# Patient Record
Sex: Male | Born: 1950 | Race: Black or African American | Hispanic: No | Marital: Married | Smoking: Never smoker
Health system: Southern US, Community
[De-identification: ages and names within clinical notes are randomized; demographics above are authoritative.]

## PROBLEM LIST (undated history)

## (undated) DIAGNOSIS — I10 Essential (primary) hypertension: Secondary | ICD-10-CM

## (undated) DIAGNOSIS — H409 Unspecified glaucoma: Secondary | ICD-10-CM

## (undated) DIAGNOSIS — M1990 Unspecified osteoarthritis, unspecified site: Secondary | ICD-10-CM

## (undated) DIAGNOSIS — H547 Unspecified visual loss: Secondary | ICD-10-CM

## (undated) DIAGNOSIS — C61 Malignant neoplasm of prostate: Secondary | ICD-10-CM

## (undated) DIAGNOSIS — E7800 Pure hypercholesterolemia, unspecified: Secondary | ICD-10-CM

## (undated) HISTORY — DX: Pure hypercholesterolemia, unspecified: E78.00

## (undated) HISTORY — PX: EYE SURGERY: SHX253

## (undated) HISTORY — PX: OTHER SURGICAL HISTORY: SHX169

## (undated) HISTORY — DX: Malignant neoplasm of prostate: C61

## (undated) HISTORY — DX: Unspecified osteoarthritis, unspecified site: M19.90

## (undated) HISTORY — DX: Unspecified glaucoma: H40.9

## (undated) HISTORY — DX: Essential (primary) hypertension: I10

## (undated) HISTORY — PX: PROSTATE BIOPSY: SHX241

---

## 2007-05-11 ENCOUNTER — Emergency Department (HOSPITAL_COMMUNITY): Admission: EM | Admit: 2007-05-11 | Discharge: 2007-05-11 | Payer: Self-pay | Admitting: *Deleted

## 2007-10-08 ENCOUNTER — Emergency Department (HOSPITAL_COMMUNITY): Admission: EM | Admit: 2007-10-08 | Discharge: 2007-10-08 | Payer: Self-pay | Admitting: Family Medicine

## 2010-06-02 ENCOUNTER — Encounter: Admission: RE | Admit: 2010-06-02 | Discharge: 2010-06-02 | Payer: Self-pay | Admitting: Occupational Medicine

## 2011-04-02 ENCOUNTER — Emergency Department (HOSPITAL_COMMUNITY)
Admission: EM | Admit: 2011-04-02 | Discharge: 2011-04-02 | Disposition: A | Discharge status: Home / Self Care | Payer: Self-pay | Attending: Emergency Medicine | Admitting: Emergency Medicine

## 2011-04-02 DIAGNOSIS — E119 Type 2 diabetes mellitus without complications: Secondary | ICD-10-CM | POA: Insufficient documentation

## 2011-04-02 DIAGNOSIS — I10 Essential (primary) hypertension: Secondary | ICD-10-CM | POA: Insufficient documentation

## 2011-04-02 DIAGNOSIS — K089 Disorder of teeth and supporting structures, unspecified: Secondary | ICD-10-CM | POA: Insufficient documentation

## 2011-04-02 DIAGNOSIS — R42 Dizziness and giddiness: Secondary | ICD-10-CM | POA: Insufficient documentation

## 2011-04-02 DIAGNOSIS — R5381 Other malaise: Secondary | ICD-10-CM | POA: Insufficient documentation

## 2011-04-02 LAB — CBC
HCT: 40.3 % (ref 39.0–52.0)
Hemoglobin: 14.5 g/dL (ref 13.0–17.0)
RBC: 5.29 MIL/uL (ref 4.22–5.81)
WBC: 6.5 10*3/uL (ref 4.0–10.5)

## 2011-04-02 LAB — URINALYSIS, ROUTINE W REFLEX MICROSCOPIC
Nitrite: NEGATIVE
Protein, ur: 100 mg/dL — AB
Urobilinogen, UA: 1 mg/dL (ref 0.0–1.0)

## 2011-04-02 LAB — COMPREHENSIVE METABOLIC PANEL
Albumin: 3.5 g/dL (ref 3.5–5.2)
Alkaline Phosphatase: 117 U/L (ref 39–117)
BUN: 16 mg/dL (ref 6–23)
CO2: 27 mEq/L (ref 19–32)
Chloride: 100 mEq/L (ref 96–112)
GFR calc Af Amer: >60 mL/min (ref >60)
GFR calc non Af Amer: >60 mL/min (ref >60)
Glucose, Bld: 262 mg/dL — ABNORMAL HIGH (ref 70–99)
Potassium: 3.9 mEq/L (ref 3.5–5.1)
Total Bilirubin: 0.6 mg/dL (ref 0.3–1.2)

## 2011-04-02 LAB — DIFFERENTIAL
Basophils Absolute: 0 10*3/uL (ref 0.0–0.1)
Eosinophils Relative: 0 % (ref 0–5)
Lymphocytes Relative: 18 % (ref 12–46)
Monocytes Relative: 9 % (ref 3–12)

## 2011-04-02 LAB — POCT I-STAT TROPONIN I: Troponin i, poc: 0.02 ng/mL (ref 0.00–0.08)

## 2011-04-18 LAB — POCT H PYLORI SCREEN: H. PYLORI SCREEN, POC: POSITIVE — AB

## 2011-04-18 LAB — DIFFERENTIAL
Basophils Absolute: 0
Eosinophils Relative: 1
Lymphocytes Relative: 37
Neutrophils Relative %: 56

## 2011-04-18 LAB — CBC
HCT: 39.6
Platelets: 205
RDW: 15.5

## 2011-07-09 ENCOUNTER — Emergency Department (HOSPITAL_COMMUNITY)
Admission: EM | Admit: 2011-07-09 | Discharge: 2011-07-10 | Disposition: A | Discharge status: Home / Self Care | Payer: Medicaid Other | Attending: Emergency Medicine | Admitting: Emergency Medicine

## 2011-07-09 ENCOUNTER — Emergency Department (HOSPITAL_COMMUNITY): Payer: Medicaid Other

## 2011-07-09 ENCOUNTER — Encounter: Payer: Self-pay | Admitting: Emergency Medicine

## 2011-07-09 DIAGNOSIS — R319 Hematuria, unspecified: Secondary | ICD-10-CM | POA: Insufficient documentation

## 2011-07-09 DIAGNOSIS — Z79899 Other long term (current) drug therapy: Secondary | ICD-10-CM | POA: Insufficient documentation

## 2011-07-09 DIAGNOSIS — M25659 Stiffness of unspecified hip, not elsewhere classified: Secondary | ICD-10-CM | POA: Insufficient documentation

## 2011-07-09 DIAGNOSIS — E119 Type 2 diabetes mellitus without complications: Secondary | ICD-10-CM | POA: Insufficient documentation

## 2011-07-09 DIAGNOSIS — M546 Pain in thoracic spine: Secondary | ICD-10-CM | POA: Insufficient documentation

## 2011-07-09 DIAGNOSIS — M25559 Pain in unspecified hip: Secondary | ICD-10-CM | POA: Insufficient documentation

## 2011-07-09 DIAGNOSIS — M549 Dorsalgia, unspecified: Secondary | ICD-10-CM

## 2011-07-09 LAB — URINALYSIS, ROUTINE W REFLEX MICROSCOPIC
Ketones, ur: NEGATIVE mg/dL
Leukocytes, UA: NEGATIVE
Nitrite: NEGATIVE
Protein, ur: NEGATIVE mg/dL
pH: 6.5 (ref 5.0–8.0)

## 2011-07-09 MED ORDER — OXYCODONE-ACETAMINOPHEN 5-325 MG PO TABS
1.0000 | ORAL_TABLET | Freq: Once | ORAL | Status: AC
  Administered 2011-07-09: 1 via ORAL
  Filled 2011-07-09: qty 1

## 2011-07-09 NOTE — ED Notes (Signed)
PT. REPORTS LEFT HIP PAIN ONSET LAST Monday , DENIES INJURY OR FALL , AMBULATORY.

## 2011-07-09 NOTE — ED Provider Notes (Signed)
History     CSN: [id number] Arrival date & time: 07/09/2011  8:35 PM   First MD Initiated Contact with Patient 07/09/11 2214      Chief Complaint  Patient presents with  . Hip Pain   Patient developed atraumatic back pain one week ago. No change throughout the week. No prior hx of same. No URI or urinary symptoms. No recent travel or leg swelling.  Patient is a 59 y.o. male presenting with back pain. The history is provided by the patient and the spouse.  Back Pain  This is a new problem. The current episode started more than 1 week ago. The problem occurs constantly. 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 The pain is present in the thoracic spine. The quality of the pain is described as aching. The pain does not radiate. The pain is moderate. The symptoms are aggravated by bending, twisting and certain positions. The pain is the same all the time. Stiffness is present all day. Pertinent negatives include no chest pain, no fever, no numbness, no weight loss, no headaches, no abdominal pain, no abdominal swelling, no bowel incontinence, no perianal numbness, no bladder incontinence, no dysuria, no pelvic pain, no leg pain, no paresthesias, no paresis, no tingling and no weakness. He has tried nothing for the symptoms.    Past Medical History  Diagnosis Date  . Diabetes mellitus     History reviewed. No pertinent past surgical history.  No family history on file.  History  Substance Use Topics  . Smoking status: Never Smoker   . Smokeless tobacco: Not on file  . Alcohol Use: No      Review of Systems  Constitutional: Negative.  Negative for fever and weight loss.  HENT: Negative for neck pain and neck stiffness.   Respiratory: Negative.   Cardiovascular: Negative for chest pain.  Gastrointestinal: Negative.  Negative for abdominal pain and bowel incontinence.  Genitourinary: Negative.  Negative  for bladder incontinence, dysuria and pelvic pain.  Musculoskeletal: Positive for back pain. Negative for myalgias, joint swelling, arthralgias and gait problem.  Neurological: Negative.  Negative for tingling, weakness, numbness, headaches and paresthesias.  Psychiatric/Behavioral: Negative.   All other systems reviewed and are negative.    Allergies  Review of patient's allergies indicates no known allergies.  Home Medications   Current Outpatient Rx  Name Route Sig Dispense Refill  . GLIPIZIDE ER 10 MG PO TB24 Oral Take 10 mg by mouth daily.        BP 194/102  Pulse 59  Temp(Src) 97.6 F (36.4 C) (Oral)  Resp 20  SpO2 100%  Physical Exam  Constitutional: He is oriented to person, place, and time. He appears well-developed and well-nourished. No distress.  HENT:  Head: Normocephalic and atraumatic.  Eyes: Conjunctivae and EOM are normal. Pupils are equal, round, and reactive to light.  Neck: Normal range of motion. Neck supple.  Cardiovascular: Normal rate, regular rhythm and normal heart sounds.  Exam reveals no gallop and no friction rub.   No murmur heard. Pulmonary/Chest: Effort normal and breath sounds normal. No respiratory distress. He has no wheezes. He has no rales. He exhibits no tenderness.       No pain with inspiration  Abdominal: Soft. Bowel sounds are normal. He exhibits no distension and no mass. There is no tenderness. There is no rebound and no guarding.       Unable to elicit any abdominal  ttp on exam  Genitourinary:       Mild ttp in left CVA but really more over posterior rib.   Musculoskeletal: He exhibits tenderness. He exhibits no edema.       Pain on palpation of taut thoracic musculature. Pain increases with lateral rotation and bending and with full weight bearing on left side. No bony crepitus. No external signs of trauma. No rash.   Neurological: He is alert and oriented to person, place, and time. He has normal reflexes. No cranial nerve  deficit. He exhibits normal muscle tone. Coordination normal.  Skin: Skin is warm and dry. No rash noted. He is not diaphoretic. No erythema. No pallor.  Psychiatric: He has a normal mood and affect. His behavior is normal. Judgment and thought content normal.    ED Course  Procedures (including critical care time)  Labs Reviewed  URINALYSIS, ROUTINE W REFLEX MICROSCOPIC - Abnormal; Notable for the following:    Glucose, UA 500 (*)    Hgb urine dipstick SMALL (*)    All other components within normal limits  URINE MICROSCOPIC-ADD ON   No results found.   1. Back pain   2. Hematuria       MDM  5 AA male with pmh significant for DM. One week hx of left mid back pain worse with movement and palpation. No reported trauma and no s/s of infection. Doubt renal colic. No UTI. Noted to have mild hematuria, so will have follow up with urology. Doubt PE or ACS. D/w and seen by Dr. Adriana Simas. Pain improved with percocet times one in dept. Feel MSK etiology most likely. No widened mediastinum on CXR. VSS. Patient comfortable with d/c home with PMD referrals and understood reasons to return.       Marcell Anger, [place] 07/10/11 [phone]

## 2011-07-09 NOTE — ED Notes (Signed)
Gave pt a urinal. Stated he would go as soon as he could

## 2011-07-10 MED ORDER — OXYCODONE-ACETAMINOPHEN 5-325 MG PO TABS: 1.0000 | ORAL_TABLET | Freq: Four times a day (QID) | ORAL | Status: AC | PRN

## 2011-07-10 NOTE — ED Notes (Signed)
Pt shows no signs of neuro deficits 

## 2011-07-10 NOTE — ED Provider Notes (Signed)
Medical screening examination/treatment/procedure(s) were conducted as a shared visit with non-physician practitioner(s) and myself.  I personally evaluated the patient during the encounter.  Back pain appears to be musculoskeletal. Mild hematuria can be followed as an outpatient. History and physical not consistent with kidney stone  Donnetta Hutching, MD 07/10/11 [phone]

## 2012-01-02 ENCOUNTER — Encounter (INDEPENDENT_AMBULATORY_CARE_PROVIDER_SITE_OTHER): Payer: Medicaid Other | Admitting: Ophthalmology

## 2012-01-02 DIAGNOSIS — H4310 Vitreous hemorrhage, unspecified eye: Secondary | ICD-10-CM

## 2012-01-02 DIAGNOSIS — E1139 Type 2 diabetes mellitus with other diabetic ophthalmic complication: Secondary | ICD-10-CM

## 2012-01-02 DIAGNOSIS — E11359 Type 2 diabetes mellitus with proliferative diabetic retinopathy without macular edema: Secondary | ICD-10-CM

## 2012-01-02 DIAGNOSIS — H2510 Age-related nuclear cataract, unspecified eye: Secondary | ICD-10-CM

## 2012-01-02 DIAGNOSIS — E1165 Type 2 diabetes mellitus with hyperglycemia: Secondary | ICD-10-CM

## 2012-01-02 DIAGNOSIS — H3581 Retinal edema: Secondary | ICD-10-CM

## 2012-01-02 DIAGNOSIS — H43819 Vitreous degeneration, unspecified eye: Secondary | ICD-10-CM

## 2012-01-13 ENCOUNTER — Ambulatory Visit (INDEPENDENT_AMBULATORY_CARE_PROVIDER_SITE_OTHER): Payer: Medicaid Other | Admitting: Ophthalmology

## 2012-01-13 DIAGNOSIS — H3581 Retinal edema: Secondary | ICD-10-CM

## 2012-01-13 DIAGNOSIS — E1165 Type 2 diabetes mellitus with hyperglycemia: Secondary | ICD-10-CM

## 2012-01-13 DIAGNOSIS — E1139 Type 2 diabetes mellitus with other diabetic ophthalmic complication: Secondary | ICD-10-CM

## 2012-02-02 ENCOUNTER — Other Ambulatory Visit (INDEPENDENT_AMBULATORY_CARE_PROVIDER_SITE_OTHER): Payer: Medicaid Other | Admitting: Ophthalmology

## 2012-02-02 DIAGNOSIS — E11359 Type 2 diabetes mellitus with proliferative diabetic retinopathy without macular edema: Secondary | ICD-10-CM

## 2012-02-02 DIAGNOSIS — E1165 Type 2 diabetes mellitus with hyperglycemia: Secondary | ICD-10-CM

## 2012-02-02 DIAGNOSIS — E1139 Type 2 diabetes mellitus with other diabetic ophthalmic complication: Secondary | ICD-10-CM

## 2012-02-08 ENCOUNTER — Ambulatory Visit (INDEPENDENT_AMBULATORY_CARE_PROVIDER_SITE_OTHER): Payer: Medicaid Other | Admitting: Ophthalmology

## 2012-02-08 DIAGNOSIS — E1165 Type 2 diabetes mellitus with hyperglycemia: Secondary | ICD-10-CM

## 2012-02-08 DIAGNOSIS — E11359 Type 2 diabetes mellitus with proliferative diabetic retinopathy without macular edema: Secondary | ICD-10-CM

## 2012-04-11 ENCOUNTER — Encounter (INDEPENDENT_AMBULATORY_CARE_PROVIDER_SITE_OTHER): Payer: Medicaid Other | Admitting: Ophthalmology

## 2012-04-11 DIAGNOSIS — E1165 Type 2 diabetes mellitus with hyperglycemia: Secondary | ICD-10-CM

## 2012-04-11 DIAGNOSIS — H3581 Retinal edema: Secondary | ICD-10-CM

## 2012-04-11 DIAGNOSIS — H35039 Hypertensive retinopathy, unspecified eye: Secondary | ICD-10-CM

## 2012-04-11 DIAGNOSIS — H4010X0 Unspecified open-angle glaucoma, stage unspecified: Secondary | ICD-10-CM

## 2012-04-11 DIAGNOSIS — E11359 Type 2 diabetes mellitus with proliferative diabetic retinopathy without macular edema: Secondary | ICD-10-CM

## 2012-04-11 DIAGNOSIS — H2510 Age-related nuclear cataract, unspecified eye: Secondary | ICD-10-CM

## 2012-04-11 DIAGNOSIS — I10 Essential (primary) hypertension: Secondary | ICD-10-CM

## 2012-04-11 DIAGNOSIS — H43819 Vitreous degeneration, unspecified eye: Secondary | ICD-10-CM

## 2012-05-10 ENCOUNTER — Other Ambulatory Visit (INDEPENDENT_AMBULATORY_CARE_PROVIDER_SITE_OTHER): Payer: Medicaid Other | Admitting: Ophthalmology

## 2012-05-10 DIAGNOSIS — H3581 Retinal edema: Secondary | ICD-10-CM

## 2012-06-11 ENCOUNTER — Ambulatory Visit (INDEPENDENT_AMBULATORY_CARE_PROVIDER_SITE_OTHER): Payer: Medicaid Other | Admitting: Ophthalmology

## 2012-08-29 DIAGNOSIS — H4010X0 Unspecified open-angle glaucoma, stage unspecified: Secondary | ICD-10-CM | POA: Insufficient documentation

## 2012-09-10 ENCOUNTER — Ambulatory Visit (INDEPENDENT_AMBULATORY_CARE_PROVIDER_SITE_OTHER): Payer: Medicaid Other | Admitting: Ophthalmology

## 2012-09-10 DIAGNOSIS — H2510 Age-related nuclear cataract, unspecified eye: Secondary | ICD-10-CM

## 2012-09-10 DIAGNOSIS — E1165 Type 2 diabetes mellitus with hyperglycemia: Secondary | ICD-10-CM

## 2012-09-10 DIAGNOSIS — E11359 Type 2 diabetes mellitus with proliferative diabetic retinopathy without macular edema: Secondary | ICD-10-CM

## 2012-09-10 DIAGNOSIS — I10 Essential (primary) hypertension: Secondary | ICD-10-CM

## 2012-09-10 DIAGNOSIS — H35039 Hypertensive retinopathy, unspecified eye: Secondary | ICD-10-CM

## 2012-09-10 DIAGNOSIS — H43819 Vitreous degeneration, unspecified eye: Secondary | ICD-10-CM

## 2012-09-21 ENCOUNTER — Ambulatory Visit (INDEPENDENT_AMBULATORY_CARE_PROVIDER_SITE_OTHER): Payer: Medicaid Other | Admitting: Ophthalmology

## 2012-09-21 DIAGNOSIS — E1139 Type 2 diabetes mellitus with other diabetic ophthalmic complication: Secondary | ICD-10-CM

## 2012-12-27 ENCOUNTER — Ambulatory Visit (INDEPENDENT_AMBULATORY_CARE_PROVIDER_SITE_OTHER): Payer: Medicaid Other | Admitting: Ophthalmology

## 2012-12-27 DIAGNOSIS — E11359 Type 2 diabetes mellitus with proliferative diabetic retinopathy without macular edema: Secondary | ICD-10-CM

## 2012-12-27 DIAGNOSIS — I10 Essential (primary) hypertension: Secondary | ICD-10-CM

## 2012-12-27 DIAGNOSIS — H35039 Hypertensive retinopathy, unspecified eye: Secondary | ICD-10-CM

## 2012-12-27 DIAGNOSIS — H43819 Vitreous degeneration, unspecified eye: Secondary | ICD-10-CM

## 2012-12-27 DIAGNOSIS — E1139 Type 2 diabetes mellitus with other diabetic ophthalmic complication: Secondary | ICD-10-CM

## 2013-01-21 ENCOUNTER — Ambulatory Visit (INDEPENDENT_AMBULATORY_CARE_PROVIDER_SITE_OTHER): Payer: Medicaid Other | Admitting: Ophthalmology

## 2013-03-18 ENCOUNTER — Encounter (INDEPENDENT_AMBULATORY_CARE_PROVIDER_SITE_OTHER): Payer: Medicaid Other | Admitting: Ophthalmology

## 2013-03-18 DIAGNOSIS — I10 Essential (primary) hypertension: Secondary | ICD-10-CM

## 2013-03-18 DIAGNOSIS — H43819 Vitreous degeneration, unspecified eye: Secondary | ICD-10-CM

## 2013-03-18 DIAGNOSIS — E1139 Type 2 diabetes mellitus with other diabetic ophthalmic complication: Secondary | ICD-10-CM

## 2013-03-18 DIAGNOSIS — H35039 Hypertensive retinopathy, unspecified eye: Secondary | ICD-10-CM

## 2013-03-18 DIAGNOSIS — H4310 Vitreous hemorrhage, unspecified eye: Secondary | ICD-10-CM

## 2013-03-18 DIAGNOSIS — E11359 Type 2 diabetes mellitus with proliferative diabetic retinopathy without macular edema: Secondary | ICD-10-CM

## 2013-03-18 DIAGNOSIS — H3581 Retinal edema: Secondary | ICD-10-CM

## 2013-03-27 ENCOUNTER — Other Ambulatory Visit (INDEPENDENT_AMBULATORY_CARE_PROVIDER_SITE_OTHER): Payer: Medicaid Other | Admitting: Ophthalmology

## 2013-03-27 DIAGNOSIS — E1139 Type 2 diabetes mellitus with other diabetic ophthalmic complication: Secondary | ICD-10-CM

## 2013-03-27 DIAGNOSIS — H3581 Retinal edema: Secondary | ICD-10-CM

## 2013-04-19 ENCOUNTER — Encounter (INDEPENDENT_AMBULATORY_CARE_PROVIDER_SITE_OTHER): Payer: Medicaid Other | Admitting: Ophthalmology

## 2013-04-19 DIAGNOSIS — H4310 Vitreous hemorrhage, unspecified eye: Secondary | ICD-10-CM

## 2013-04-19 DIAGNOSIS — E1139 Type 2 diabetes mellitus with other diabetic ophthalmic complication: Secondary | ICD-10-CM

## 2013-04-19 DIAGNOSIS — E11349 Type 2 diabetes mellitus with severe nonproliferative diabetic retinopathy without macular edema: Secondary | ICD-10-CM

## 2013-04-19 DIAGNOSIS — E11359 Type 2 diabetes mellitus with proliferative diabetic retinopathy without macular edema: Secondary | ICD-10-CM

## 2013-05-27 ENCOUNTER — Ambulatory Visit (INDEPENDENT_AMBULATORY_CARE_PROVIDER_SITE_OTHER): Payer: Medicaid Other | Admitting: Ophthalmology

## 2013-07-03 ENCOUNTER — Encounter (INDEPENDENT_AMBULATORY_CARE_PROVIDER_SITE_OTHER): Payer: Medicare Other | Admitting: Ophthalmology

## 2013-07-03 DIAGNOSIS — I10 Essential (primary) hypertension: Secondary | ICD-10-CM

## 2013-07-03 DIAGNOSIS — H3581 Retinal edema: Secondary | ICD-10-CM

## 2013-07-03 DIAGNOSIS — H35039 Hypertensive retinopathy, unspecified eye: Secondary | ICD-10-CM

## 2013-07-03 DIAGNOSIS — H43819 Vitreous degeneration, unspecified eye: Secondary | ICD-10-CM

## 2013-07-03 DIAGNOSIS — E11359 Type 2 diabetes mellitus with proliferative diabetic retinopathy without macular edema: Secondary | ICD-10-CM

## 2013-07-03 DIAGNOSIS — E1139 Type 2 diabetes mellitus with other diabetic ophthalmic complication: Secondary | ICD-10-CM

## 2013-07-11 ENCOUNTER — Encounter (INDEPENDENT_AMBULATORY_CARE_PROVIDER_SITE_OTHER): Payer: Medicare Other | Admitting: Ophthalmology

## 2013-07-11 DIAGNOSIS — H3581 Retinal edema: Secondary | ICD-10-CM

## 2013-07-31 ENCOUNTER — Ambulatory Visit (INDEPENDENT_AMBULATORY_CARE_PROVIDER_SITE_OTHER): Payer: Medicaid Other | Admitting: Ophthalmology

## 2013-08-12 ENCOUNTER — Emergency Department (HOSPITAL_COMMUNITY)
Admission: EM | Admit: 2013-08-12 | Discharge: 2013-08-13 | Disposition: A | Discharge status: Home / Self Care | Payer: Medicare Other | Attending: Emergency Medicine | Admitting: Emergency Medicine

## 2013-08-12 ENCOUNTER — Encounter (HOSPITAL_COMMUNITY): Payer: Self-pay | Admitting: Emergency Medicine

## 2013-08-12 DIAGNOSIS — R197 Diarrhea, unspecified: Secondary | ICD-10-CM | POA: Insufficient documentation

## 2013-08-12 DIAGNOSIS — E119 Type 2 diabetes mellitus without complications: Secondary | ICD-10-CM | POA: Insufficient documentation

## 2013-08-12 DIAGNOSIS — K5900 Constipation, unspecified: Secondary | ICD-10-CM

## 2013-08-12 DIAGNOSIS — R1110 Vomiting, unspecified: Secondary | ICD-10-CM | POA: Insufficient documentation

## 2013-08-12 DIAGNOSIS — Z79899 Other long term (current) drug therapy: Secondary | ICD-10-CM | POA: Insufficient documentation

## 2013-08-12 NOTE — ED Notes (Signed)
PT reports Vomiting and diarrhea that started SAT. None on Sunday and Vomiting and diarrhea started again tonight.

## 2013-08-13 NOTE — ED Notes (Signed)
Pt reports he vomited 48 hrs ago,

## 2013-08-13 NOTE — ED Notes (Signed)
Pt reports he vomited x 3 overnight 48 hrs ago.  Only had gingerale all day Sunday.  Did not eat any food.  No further vomiting until 2000 Monday evening.  At 1900 had "bad bowel movement".  Wife said foul smelling.  Had not had a BM x 6-7 days per pt report.  Denies pain currently, states he feels better.  No more nausea.  Is here now because he feels weak and "my wife made me come."

## 2013-08-13 NOTE — ED Notes (Signed)
Pt given PO challenge. Drinking and eating crackers without incident.  Denies abdominal pain, nausea, vomiting.  Pt got up to BR, no issues.

## 2013-08-13 NOTE — ED Provider Notes (Signed)
CSN: [id number]     Arrival date & time 08/12/13  2312 History   First MD Initiated Contact with Patient 08/13/13 0103     Chief Complaint  Patient presents with  . Emesis  . Diarrhea   (Consider location/radiation/quality/duration/timing/severity/associated sxs/prior Treatment) Patient is a 63 y.o. male presenting with vomiting.  Emesis Severity:  Moderate Duration: tonight. Timing: once. Number of daily episodes:  1 Quality:  Undigested food Progression:  Resolved Chronicity:  New Relieved by:  Nothing Worsened by:  Nothing tried Associated symptoms: no abdominal pain, no diarrhea and no fever   Associated symptoms comment:  Constipation   Past Medical History  Diagnosis Date  . Diabetes mellitus    Past Surgical History  Procedure Laterality Date  . Eye surgery     No family history on file. History  Substance Use Topics  . Smoking status: Never Smoker   . Smokeless tobacco: Not on file  . Alcohol Use: No    Review of Systems  Constitutional: Negative for fever.  HENT: Negative for congestion.   Respiratory: Negative for cough and shortness of breath.   Cardiovascular: Negative for chest pain.  Gastrointestinal: Positive for vomiting. Negative for nausea, abdominal pain and diarrhea.  All other systems reviewed and are negative.    Allergies  Review of patient's allergies indicates no known allergies.  Home Medications   Current Outpatient Rx  Name  Route  Sig  Dispense  Refill  . Alogliptin-Pioglitazone (OSENI) 25-15 MG TABS   Oral   Take 1 tablet by mouth daily.         Marland Kitchen amLODipine (NORVASC) 10 MG tablet   Oral   Take 10 mg by mouth daily.         . brimonidine-timolol (COMBIGAN) 0.2-0.5 % ophthalmic solution   Both Eyes   Place 1 drop into both eyes 2 (two) times daily.         . dorzolamide (TRUSOPT) 2 % ophthalmic solution   Both Eyes   Place 1 drop into both eyes 3 (three) times daily.         . hydrochlorothiazide  (HYDRODIURIL) 25 MG tablet   Oral   Take 25 mg by mouth daily.         Marland Kitchen lisinopril (PRINIVIL,ZESTRIL) 10 MG tablet   Oral   Take 10 mg by mouth daily.         Marland Kitchen loteprednol (LOTEMAX) 0.5 % ophthalmic suspension   Both Eyes   Place 1 drop into both eyes 3 (three) times daily.         Marland Kitchen lovastatin (MEVACOR) 20 MG tablet   Oral   Take 20 mg by mouth daily.         . metFORMIN (GLUCOPHAGE) 500 MG tablet   Oral   Take 500 mg by mouth 2 (two) times daily with a meal.          BP 126/76  Pulse 98  Temp(Src) 97.6 F (36.4 C) (Oral)  Resp 16  SpO2 100% Physical Exam  Nursing note and vitals reviewed. Constitutional: He is oriented to person, place, and time. He appears well-developed and well-nourished. No distress.  HENT:  Head: Normocephalic and atraumatic.  Mouth/Throat: Oropharynx is clear and moist.  Eyes: Conjunctivae are normal. Pupils are equal, round, and reactive to light. No scleral icterus.  Neck: Neck supple.  Cardiovascular: Normal rate, regular rhythm, normal heart sounds and intact distal pulses.   No murmur heard. Pulmonary/Chest: Effort normal and breath  sounds normal. No stridor. No respiratory distress. He has no wheezes. He has no rales.  Abdominal: Soft. He exhibits no distension. There is no tenderness. There is no rebound and no guarding.  Musculoskeletal: Normal range of motion. He exhibits no edema.  Neurological: He is alert and oriented to person, place, and time.  Skin: Skin is warm and dry. No rash noted.  Psychiatric: He has a normal mood and affect. His behavior is normal.    ED Course  Procedures (including critical care time) Labs Review Labs Reviewed - No data to display Imaging Review No results found.  EKG Interpretation   None       MDM   1. Constipation   2. Vomiting    Well appearing 63 yo male who had one episode of vomiting which was at the same time as he had a voluminous solid bowel movement.  He stated that  he was constipated for several days.  (his wife reports this is likely secondary to diet).  After his one episode of emesis and this large bowel movement, he felt much better.  On exam, well appearing, not distressed, abd soft and nontender.  His history is indicative of mild constipation which self resolved.  I advised dietary changes.  Will ensure he can tolerate POs without becoming nauseated or vomiting.  He already has PCP follow up in less than 36 hours.    Tolerated PO's well.    Houston Siren, MD 08/13/13 [phone]

## 2013-08-13 NOTE — ED Notes (Signed)
Awaiting orders

## 2013-08-13 NOTE — Discharge Instructions (Signed)

## 2013-09-05 DIAGNOSIS — I10 Essential (primary) hypertension: Secondary | ICD-10-CM | POA: Insufficient documentation

## 2013-09-05 DIAGNOSIS — E785 Hyperlipidemia, unspecified: Secondary | ICD-10-CM | POA: Insufficient documentation

## 2013-09-05 DIAGNOSIS — E119 Type 2 diabetes mellitus without complications: Secondary | ICD-10-CM | POA: Insufficient documentation

## 2013-11-06 ENCOUNTER — Ambulatory Visit (INDEPENDENT_AMBULATORY_CARE_PROVIDER_SITE_OTHER): Payer: Medicare Other | Admitting: Ophthalmology

## 2013-11-06 DIAGNOSIS — E1139 Type 2 diabetes mellitus with other diabetic ophthalmic complication: Secondary | ICD-10-CM

## 2013-11-06 DIAGNOSIS — H43819 Vitreous degeneration, unspecified eye: Secondary | ICD-10-CM

## 2013-11-06 DIAGNOSIS — E1165 Type 2 diabetes mellitus with hyperglycemia: Secondary | ICD-10-CM

## 2013-11-06 DIAGNOSIS — H4310 Vitreous hemorrhage, unspecified eye: Secondary | ICD-10-CM

## 2013-11-06 DIAGNOSIS — H35039 Hypertensive retinopathy, unspecified eye: Secondary | ICD-10-CM

## 2013-11-06 DIAGNOSIS — I10 Essential (primary) hypertension: Secondary | ICD-10-CM

## 2013-11-06 DIAGNOSIS — E11359 Type 2 diabetes mellitus with proliferative diabetic retinopathy without macular edema: Secondary | ICD-10-CM

## 2014-02-05 DIAGNOSIS — Z961 Presence of intraocular lens: Secondary | ICD-10-CM | POA: Insufficient documentation

## 2014-03-12 ENCOUNTER — Ambulatory Visit (INDEPENDENT_AMBULATORY_CARE_PROVIDER_SITE_OTHER): Payer: Medicare Other | Admitting: Ophthalmology

## 2014-11-24 ENCOUNTER — Ambulatory Visit
Admission: RE | Admit: 2014-11-24 | Discharge: 2014-11-24 | Disposition: A | Discharge status: Home / Self Care | Payer: Medicare Other | Source: Ambulatory Visit | Attending: Radiation Oncology | Admitting: Radiation Oncology

## 2014-11-24 ENCOUNTER — Ambulatory Visit: Payer: Medicare Other | Admitting: Radiation Oncology

## 2014-11-24 NOTE — Progress Notes (Signed)
Vitals stable. Denies pain. Diminished vision related to effects of diabetes. Denies headache, dizziness, nausea, vomiting, diplopia or ringing in the ears. Denies cough or SOB. Here today with his sister.

## 2014-12-03 ENCOUNTER — Encounter: Payer: Self-pay | Admitting: Radiation Oncology

## 2014-12-03 NOTE — Progress Notes (Signed)
GU Location of Tumor / Histology: prostatic adenocarcinoma  If Prostate Cancer, Gleason Score is (3 + 4) and PSA is (11.27)  Christopher Rivas was diagnosed with prostate ca in 2014 and has been under active surveillance.  Biopsies of prostate (if applicable) revealed:    Past/Anticipated interventions by urology, if any: active surveillance, prostate biopsy, referral to rad onc  Past/Anticipated interventions by medical oncology, if any: no  Weight changes, if any: no  Bowel/Bladder complaints, if any: urinary frequency, nocturia x4, and weak urine stream    Nausea/Vomiting, if any: no  Pain issues, if any:  no  SAFETY ISSUES:  Prior radiation? no  Pacemaker/ICD? no  Possible current pregnancy? no  Is the patient on methotrexate? no  Current Complaints / other details:  64 year old male. Separated. Retired. Has 4 children.

## 2014-12-10 ENCOUNTER — Ambulatory Visit
Admission: RE | Admit: 2014-12-10 | Discharge: 2014-12-10 | Disposition: A | Discharge status: Home / Self Care | Payer: Medicare HMO | Source: Ambulatory Visit | Attending: Radiation Oncology | Admitting: Radiation Oncology

## 2014-12-10 ENCOUNTER — Encounter: Payer: Self-pay | Admitting: Radiation Oncology

## 2014-12-10 VITALS — BP 144/75 | HR 85 | Resp 16 | Ht 68.0 in | Wt 173.8 lb

## 2014-12-10 DIAGNOSIS — Z79899 Other long term (current) drug therapy: Secondary | ICD-10-CM | POA: Insufficient documentation

## 2014-12-10 DIAGNOSIS — C61 Malignant neoplasm of prostate: Secondary | ICD-10-CM | POA: Insufficient documentation

## 2014-12-10 DIAGNOSIS — Z510 Encounter for antineoplastic radiation therapy: Secondary | ICD-10-CM | POA: Diagnosis present

## 2014-12-10 NOTE — Progress Notes (Signed)
Radiation Oncology         [phone] ________________________________  Initial outpatient Consultation  Name: Christopher Rivas MRN: [id number]  Date: 12/10/2014  DOB: Nov 21, 1950  VO:[id number] NOT IN SYSTEM  Lowella Bandy, MD   REFERRING PHYSICIAN: Lowella Bandy, MD  DIAGNOSIS: 64 y.o. gentleman with stage T1c adenocarcinoma of the prostate with a Gleason's score of 3+4 and a PSA of 11.27    ICD-9-CM ICD-10-CM   1. Prostate cancer [place] PRESENT ILLNESS::Christopher Rivas is a 64 y.o. gentleman with a family history of prostate cancer in his father and 2 brothers who was found to have an elevated PSA of 11.27 in 2014. This led to a prostate biopsy on 02/21/2013 revealing prostate cancer with a maximum Gleason score of 3+4 in the distribution noted below:    The patient elected to pursue active surveillance. His PSA on 10/02/2013 was 9.54. His PSA on 04/09/2014 was 10.28.  The patient proceeded to follow-up transrectal ultrasound with 12 biopsies of the prostate on 04/30/2014.  The prostate volume measured 115 cc.  Out of 12 core biopsies, 5 were now positive.  The maximum Gleason score was 3+3, and this was seen in both sides in the distribution shown below.   He was noted to have a PSA of 9.32 on 11/03/2014.  On 11/10/2014, he followed up for evaluation in urology by Dr. Janice Norrie, and digital rectal examination was performed at that time revealing no nodules.    The patient reviewed the biopsy results with his urologist and he has kindly been referred today for discussion of potential radiation treatment options.  PREVIOUS RADIATION THERAPY: No  PAST MEDICAL HISTORY:  has a past medical history of Diabetes mellitus; Prostate cancer; Arthritis; Glaucoma; Hypercholesterolemia; and Hypertension.    PAST SURGICAL HISTORY: Past Surgical History  Procedure Laterality Date  . Eye surgery    . Cataract surgery    . Prostate biopsy      FAMILY HISTORY: family history includes Cancer  in his brother and brother.  SOCIAL HISTORY:  reports that he has never smoked. He has never used smokeless tobacco. He reports that he does not drink alcohol or use illicit drugs.  ALLERGIES: Review of patient's allergies indicates no known allergies.  MEDICATIONS:  Current Outpatient Prescriptions  Medication Sig Dispense Refill  . ACCU-CHEK AVIVA PLUS test strip 2 (two) times daily. for testing  1  . amLODipine (NORVASC) 10 MG tablet Take 10 mg by mouth daily.    . brimonidine-timolol (COMBIGAN) 0.2-0.5 % ophthalmic solution Place 1 drop into both eyes 2 (two) times daily.    . dorzolamide (TRUSOPT) 2 % ophthalmic solution Place 1 drop into both eyes 3 (three) times daily.    Marland Kitchen gabapentin (NEURONTIN) 300 MG capsule   0  . hydrochlorothiazide (HYDRODIURIL) 25 MG tablet Take 25 mg by mouth daily.    Marland Kitchen lisinopril (PRINIVIL,ZESTRIL) 10 MG tablet Take 10 mg by mouth daily.    Marland Kitchen lovastatin (MEVACOR) 20 MG tablet Take by mouth.    Marland Kitchen NOVOFINE 32G X 6 MM MISC   0  . pioglitazone (ACTOS) 15 MG tablet Take by mouth.    . sitaGLIPtin-metformin (JANUMET) 50-1000 MG per tablet Take by mouth.    . Alogliptin-Pioglitazone (OSENI) 25-15 MG TABS Take 1 tablet by mouth daily.    . fluorometholone (FML FORTE) 0.25 % ophthalmic suspension Apply to eye.    Marland Kitchen LEVEMIR FLEXTOUCH 100 UNIT/ML Pen   0  .  loteprednol (LOTEMAX) 0.5 % ophthalmic suspension Place 1 drop into both eyes 3 (three) times daily.    . metFORMIN (GLUCOPHAGE) 500 MG tablet Take 500 mg by mouth 2 (two) times daily with a meal.    . prednisoLONE acetate (PRED FORTE) 1 % ophthalmic suspension Apply to eye.     No current facility-administered medications for this encounter.    REVIEW OF SYSTEMS:  A 15 point review of systems is documented in the electronic medical record. This was obtained by the nursing staff. However, I reviewed this with the patient to discuss relevant findings and make appropriate changes.  A comprehensive review of  systems was negative..  The patient completed an IPSS and IIEF questionnaire.  His IPSS score was 10 indicating moderate urinary outflow obstructive symptoms.  He indicated that his erectile function is not able to complete sexual activity.   PHYSICAL EXAM: This patient is in no acute distress.  He is alert and oriented.   height is 5\' 8"  (1.727 m) and weight is 173 lb 12.8 oz (78.835 kg). His blood pressure is 144/75 and his pulse is 85. His respiration is 16.  He exhibits no respiratory distress or labored breathing.  He appears neurologically intact.  His mood is pleasant.  His affect is appropriate.  Please note the digital rectal exam findings described above.  KPS = 100  100 - Normal; no complaints; no evidence of disease. 90   - Able to carry on normal activity; minor signs or symptoms of disease. 80   - Normal activity with effort; some signs or symptoms of disease. 69   - Cares for self; unable to carry on normal activity or to do active work. 60   - Requires occasional assistance, but is able to care for most of his personal needs. 50   - Requires considerable assistance and frequent medical care. 35   - Disabled; requires special care and assistance. 54   - Severely disabled; hospital admission is indicated although death not imminent. 6   - Very sick; hospital admission necessary; active supportive treatment necessary. 10   - Moribund; fatal processes progressing rapidly. 0     - Dead  Karnofsky DA, Abelmann [place], Craver LS and Burchenal [facility] [phone]) The use of the nitrogen mustards in the palliative treatment of carcinoma: with particular reference to bronchogenic carcinoma Cancer 1 634-56   LABORATORY DATA:  Lab Results  Component Value Date   WBC 6.5 04/02/2011   HGB 14.5 04/02/2011   HCT 40.3 04/02/2011   MCV 76.2* 04/02/2011   PLT 156 04/02/2011   Lab Results  Component Value Date   NA 136 04/02/2011   K 3.9 04/02/2011   CL 100 04/02/2011   CO2 27 04/02/2011   Lab  Results  Component Value Date   ALT 23 04/02/2011   AST 15 04/02/2011   ALKPHOS 117 04/02/2011   BILITOT 0.6 04/02/2011     RADIOGRAPHY: No results found.    IMPRESSION: This gentleman is a 65 y.o. 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  His T-Stage, Gleason's Score, and PSA put him into the intermediate risk group.  Accordingly he is eligible for a variety of potential treatment options including intensity modulated radiotherapy he is not ideally suited for prostate seed implant given the large volume of the prostate gland.Marland Kitchen  PLAN:Today I reviewed the findings and workup thus far.  We discussed the natural history  of prostate cancer.  We reviewed the the implications of T-stage, Gleason's Score, and PSA on decision-making and outcomes in prostate cancer.  We discussed radiation treatment in the management of prostate cancer with regard to the logistics and delivery of external beam radiation treatment as well as the logistics and delivery of prostate brachytherapy.  We compared and contrasted each of these approaches and also compared these against prostatectomy.  The patient expressed interest in external beam radiotherapy.  The patient would like to proceed with prostate IMRT.  I will share my findings with Dr. Janice Norrie and move forward with scheduling placement of three gold fiducial markers into the prostate to proceed with IMRT in the near future.   He has an appointment with Dr. Janice Norrie on 12/24/2014.  I enjoyed meeting with him today, and will look forward to participating in the care of this very nice gentleman.   I spent 60 minutes face to face with the patient and more than 50% of that time was spent in counseling and/or coordination of care.   ------------------------------------------------  Sheral Apley. Tammi Klippel, M.D.

## 2014-12-10 NOTE — Progress Notes (Signed)
See progress note under physician encounter. 

## 2014-12-10 NOTE — Progress Notes (Signed)
Vitals stable. Patient denies pain. Patient accompanied by one of his sisters today. Both have expressed because of the patient's diminished vision transportation could be a challenge. IPSS 10 noted. Denies dysuria, hematuria, weight loss or night sweats. Reports rare leakage. Reports intermittent difficulty emptying. Denies urgency. Reports nocturia x 4. Denies diarrhea, constipation or pain associated with bowel movement.

## 2014-12-11 ENCOUNTER — Telehealth: Payer: Self-pay | Admitting: *Deleted

## 2014-12-11 NOTE — Telephone Encounter (Signed)
Called patient to inform of gold seed placement on 01-13-15- arrival time - 2:30 pm @ Dr. Janice Norrie' Office and his sim on 01-16-15 @ 9 am @ Dr. Johny Shears Office, spoke with patient and he is aware of these appts.

## 2014-12-16 ENCOUNTER — Encounter: Payer: Self-pay | Admitting: *Deleted

## 2014-12-16 NOTE — Progress Notes (Signed)
[place] Work  Clinical Social Work was referred by nurse for assessment of psychosocial needs.  Clinical Social Worker contacted patient and his sister, Christopher Rivas [phone]) to offer support and assess for needs. Pt relies on family for transportation and feels he cannot use [employer] bus. CSW also contacted [employer], but has not heard back to date. He is open to referral to [facility] [employer] volunteer driver program. Per sister, she feels family can help with most days to treatment. Sister works Tuesdays and Thursdays and this is the biggest need. CSW explained [facility] [facility] [employer] process to sister as well. [facility] requires a 4 business day notice and CSW would need dates and times of appointments in order to request drivers. CSW sending to RN to see if scheduling appts could occur prior to [facility]. CSW to follow and assist with transportation. Pt denied any other needs or concerns at this time.   Clinical Social Work interventions: Problem solving to facilitate treatment adherance  Loren Racer, [place] Worker [place]  [place] Phone: [phone] Fax: [phone]

## 2015-01-13 ENCOUNTER — Telehealth: Payer: Self-pay | Admitting: Radiation Oncology

## 2015-01-13 NOTE — Telephone Encounter (Signed)
Patient confirmed simulation appt for Friday, June 24th 0900. Patient verbalized his younger sister would be bringing him to this appt.

## 2015-01-16 ENCOUNTER — Ambulatory Visit
Admission: RE | Admit: 2015-01-16 | Discharge: 2015-01-16 | Disposition: A | Discharge status: Home / Self Care | Payer: Medicare HMO | Source: Ambulatory Visit | Attending: Radiation Oncology | Admitting: Radiation Oncology

## 2015-01-16 ENCOUNTER — Encounter: Payer: Self-pay | Admitting: *Deleted

## 2015-01-16 DIAGNOSIS — Z510 Encounter for antineoplastic radiation therapy: Secondary | ICD-10-CM | POA: Diagnosis not present

## 2015-01-16 DIAGNOSIS — C61 Malignant neoplasm of prostate: Secondary | ICD-10-CM

## 2015-01-16 NOTE — Progress Notes (Signed)
[place] Work  Clinical Social Work was referred by nurse for assessment of psychosocial needs due to transportation issues as pt is visually impaired.  Clinical Social Worker met with patient, his wife and sister, Wells Guiles after [facility] to review transportation options. Pt feels he cannot maneuver [employer], but CSW did find some additional resources available through [employer] that could teach him how to use [employer] and other options to assist him. Pt open to referral to [facility], but also feels sister, Levander Campion can help bring pt to most rad treatments. This sister is currently on vacation and CSW will reach out to her to help coordinate options for transportation. CSW provided pt and family contact information for CSW team and [facility] as well. Pt and family agree to reach out as needed, CSW will be following and working on additional coordination of services.   Pt reports he feels much more at ease after SIM today and less anxious.   Clinical Social Work interventions: Catering manager and referral Facilitation of treatment adherence  Loren Racer, [place] Worker [place]  [place] Phone: [phone] Fax: [phone]

## 2015-01-16 NOTE — Progress Notes (Signed)
  Radiation Oncology         [phone] ________________________________  Name: Christopher Rivas MRN: [id number]  Date: 01/16/2015  DOB: [birth year]  SIMULATION AND TREATMENT PLANNING NOTE    ICD-9-CM ICD-10-CM   1. Prostate cancer 185 C61     DIAGNOSIS: 64 y.o. gentleman with stage T1c adenocarcinoma of the prostate with a Gleason's score of 3+4 and a PSA of 11.27  NARRATIVE:  The patient was brought to the [facility].  Identity was confirmed.  All relevant records and images related to the planned course of therapy were reviewed.  The patient freely provided informed written consent to proceed with treatment after reviewing the details related to the planned course of therapy. The consent form was witnessed and verified by the simulation staff.  Then, the patient was set-up in a stable reproducible supine position for radiation therapy.  A vacuum lock pillow device was custom fabricated to position his legs in a reproducible immobilized position.  Then, I performed a urethrogram under sterile conditions to identify the prostatic apex.  CT images were obtained.  Surface markings were placed.  The CT images were loaded into the planning software.  Then the prostate target and avoidance structures including the rectum, bladder, bowel and hips were contoured.  Treatment planning then occurred.  The radiation prescription was entered and confirmed.  A total of 1 complex treatment devices were fabricated. I have requested : Intensity Modulated Radiotherapy (IMRT) is medically necessary for this case for the following reason:  Rectal sparing.Marland Kitchen  PLAN:  The patient will receive 78 Gy in 40 fractions.  This document serves as a record of services personally performed by Tyler Pita, MD. It was created on his behalf by Darcus Austin, a trained medical scribe. The creation of this record is based on the scribe's personal observations and the provider's statements to them. This document has  been checked and approved by the attending provider.     ________________________________  Sheral Apley. Tammi Klippel, M.D.

## 2015-01-19 DIAGNOSIS — Z510 Encounter for antineoplastic radiation therapy: Secondary | ICD-10-CM | POA: Diagnosis not present

## 2015-01-20 DIAGNOSIS — Z510 Encounter for antineoplastic radiation therapy: Secondary | ICD-10-CM | POA: Diagnosis not present

## 2015-01-23 ENCOUNTER — Encounter: Payer: Self-pay | Admitting: *Deleted

## 2015-01-23 NOTE — Progress Notes (Signed)
[place] Work  Clinical Social Work following for possible transportation concerns. CSW spoke with pt on 01/21/15 to reassess transportation concerns. Pt reports his sister can drive him to his appointments next week on 7/6-7/8. Pt provided CSW with his sister's contact info to confirm. CSW called sister Diane at [phone] on 01/21/15 and 01/23/15 to inquire/confirm transportation plan. CSW left message stating options for transportation and need for 5 business day notice to arrange volunteer driver through [employer]. CSW awaits return call, but it appears pt has transportation for first week of treatment.   Clinical Social Work interventions: Barista of treatment adherence  Loren Racer, [facility] Worker [place]  [place] Phone: [phone] Fax: [phone]

## 2015-01-28 ENCOUNTER — Encounter: Payer: Self-pay | Admitting: Medical Oncology

## 2015-01-28 ENCOUNTER — Ambulatory Visit
Admission: RE | Admit: 2015-01-28 | Discharge: 2015-01-28 | Disposition: A | Discharge status: Home / Self Care | Payer: Medicare HMO | Source: Ambulatory Visit | Attending: Radiation Oncology | Admitting: Radiation Oncology

## 2015-01-28 DIAGNOSIS — Z510 Encounter for antineoplastic radiation therapy: Secondary | ICD-10-CM | POA: Diagnosis not present

## 2015-01-28 DIAGNOSIS — C61 Malignant neoplasm of prostate: Secondary | ICD-10-CM

## 2015-01-28 NOTE — Progress Notes (Signed)
Oncology Nurse Navigator Documentation  Oncology Nurse Navigator Flowsheets 01/28/2015  Referral date to RadOnc/MedOnc 12/10/2014  Navigator Encounter Type Treatment  Patient Visit Type Radonc  Treatment Phase First Radiation Tx- I met patient for the first time today and introduced myself to Christopher Rivas and his sister. I discussed my role as the navigator and gave her my business card.  Barriers/Navigation Needs Transportation- Pt has diminished vision related to his diabetes. His sister provides transportation for Christopher Rivas but she does have a job on Thursdays. She has spoken with the social worker about getting transportation if she is not able to bring him. I asked her to please contact me several days in advance if she is not able to bring him or if I need to help get him transportation. She voiced understanding. Pt states his first day went well.  Support Groups/Services Friends and Family  Time Spent with Patient 30

## 2015-01-29 ENCOUNTER — Ambulatory Visit
Admission: RE | Admit: 2015-01-29 | Discharge: 2015-01-29 | Disposition: A | Discharge status: Home / Self Care | Payer: Medicare HMO | Source: Ambulatory Visit | Attending: Radiation Oncology | Admitting: Radiation Oncology

## 2015-01-29 DIAGNOSIS — Z510 Encounter for antineoplastic radiation therapy: Secondary | ICD-10-CM | POA: Diagnosis not present

## 2015-01-29 NOTE — Progress Notes (Signed)
Chart note: The patient began his VMAT IMRT on 01/28/2015 in the management of his carcinoma the prostate.  He is being treated with 2 sets of dynamic MLCs, dual ARC VMAT, with 6 MV photons.  This represents one set of IMRT treatment devices [id number]).

## 2015-01-30 ENCOUNTER — Encounter: Payer: Self-pay | Admitting: Radiation Oncology

## 2015-01-30 ENCOUNTER — Ambulatory Visit
Admission: RE | Admit: 2015-01-30 | Discharge: 2015-01-30 | Disposition: A | Discharge status: Home / Self Care | Payer: Medicare HMO | Source: Ambulatory Visit | Attending: Radiation Oncology | Admitting: Radiation Oncology

## 2015-01-30 VITALS — BP 141/84 | HR 72 | Resp 16 | Wt 173.2 lb

## 2015-01-30 DIAGNOSIS — C61 Malignant neoplasm of prostate: Secondary | ICD-10-CM

## 2015-01-30 DIAGNOSIS — Z510 Encounter for antineoplastic radiation therapy: Secondary | ICD-10-CM | POA: Diagnosis not present

## 2015-01-30 NOTE — Progress Notes (Addendum)
Weight and vitals stable. Denies dysuria, hematuria, weight loss or night sweats. Reports rare leakage. Reports intermittent difficulty emptying. Denies urgency. Reports nocturia x 4. Denies diarrhea, constipation or pain associated with bowel movement.  BP 141/84 mmHg  Pulse 72  Resp 16  Wt 173 lb 3.2 oz (78.563 kg) Wt Readings from Last 3 Encounters:  01/30/15 173 lb 3.2 oz (78.563 kg)  12/10/14 173 lb 12.8 oz (78.835 kg)  11/24/14 171 lb 8 oz (77.792 kg)   Oriented patient and his sister to staff and routine of the clinic. Educated patient reference potential side effects and management such as, fatigue, urinary/bladder changes, and diarrhea. 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 Patient verbalized understanding of all reviewed.

## 2015-01-30 NOTE — Progress Notes (Signed)
  Radiation Oncology         [phone]   Name: Christopher Rivas MRN: [id number]   Date: 01/30/2015  DOB: 1950-10-11   Weekly Radiation Therapy Management    ICD-9-CM ICD-10-CM   1. Prostate cancer 185 C61     Current Dose: 5.85 Gy  Planned Dose:  78 Gy  Narrative The patient presents for routine under treatment assessment. Weight and vitals stable. He denies dysuria, hematuria, and night sweats. He does report rare leakage. He states he has intermittent difficulty emptying. Denies urgency. Reports nocturia x 4. Denies diarrhea, constipation or pain associated with bowel movement. The patient is without additional complaints at this time. The pt was given a Radiation Therapy and You book. Set-up films were reviewed. The chart was checked.  Physical Findings  weight is 173 lb 3.2 oz (78.563 kg). His blood pressure is 141/84 and his pulse is 72. His respiration is 16. . Weight essentially stable.  No significant changes.  Impression The patient is tolerating radiation.  Plan Continue treatment as planned.     This document serves as a record of services personally performed by Tyler Pita, MD. It was created on his behalf by Darcus Austin, a trained medical scribe. The creation of this record is based on the scribe's personal observations and the provider's statements to them. This document has been checked and approved by the attending provider.      Sheral Apley Tammi Klippel, M.D.

## 2015-02-02 ENCOUNTER — Ambulatory Visit
Admission: RE | Admit: 2015-02-02 | Discharge: 2015-02-02 | Disposition: A | Discharge status: Home / Self Care | Payer: Medicare HMO | Source: Ambulatory Visit | Attending: Radiation Oncology | Admitting: Radiation Oncology

## 2015-02-02 DIAGNOSIS — Z510 Encounter for antineoplastic radiation therapy: Secondary | ICD-10-CM | POA: Diagnosis not present

## 2015-02-03 ENCOUNTER — Ambulatory Visit
Admission: RE | Admit: 2015-02-03 | Discharge: 2015-02-03 | Disposition: A | Discharge status: Home / Self Care | Payer: Medicare HMO | Source: Ambulatory Visit | Attending: Radiation Oncology | Admitting: Radiation Oncology

## 2015-02-03 DIAGNOSIS — Z510 Encounter for antineoplastic radiation therapy: Secondary | ICD-10-CM | POA: Diagnosis not present

## 2015-02-04 ENCOUNTER — Ambulatory Visit
Admission: RE | Admit: 2015-02-04 | Discharge: 2015-02-04 | Disposition: A | Discharge status: Home / Self Care | Payer: Medicare HMO | Source: Ambulatory Visit | Attending: Radiation Oncology | Admitting: Radiation Oncology

## 2015-02-04 DIAGNOSIS — Z510 Encounter for antineoplastic radiation therapy: Secondary | ICD-10-CM | POA: Diagnosis not present

## 2015-02-05 ENCOUNTER — Ambulatory Visit
Admission: RE | Admit: 2015-02-05 | Discharge: 2015-02-05 | Disposition: A | Discharge status: Home / Self Care | Payer: Medicare HMO | Source: Ambulatory Visit | Attending: Radiation Oncology | Admitting: Radiation Oncology

## 2015-02-05 DIAGNOSIS — Z510 Encounter for antineoplastic radiation therapy: Secondary | ICD-10-CM | POA: Diagnosis not present

## 2015-02-06 ENCOUNTER — Ambulatory Visit
Admission: RE | Admit: 2015-02-06 | Discharge: 2015-02-06 | Disposition: A | Discharge status: Home / Self Care | Payer: Medicare HMO | Source: Ambulatory Visit | Attending: Radiation Oncology | Admitting: Radiation Oncology

## 2015-02-06 ENCOUNTER — Encounter: Payer: Self-pay | Admitting: Radiation Oncology

## 2015-02-06 VITALS — BP 135/89 | HR 75 | Resp 16 | Wt 173.5 lb

## 2015-02-06 DIAGNOSIS — Z510 Encounter for antineoplastic radiation therapy: Secondary | ICD-10-CM | POA: Diagnosis not present

## 2015-02-06 DIAGNOSIS — C61 Malignant neoplasm of prostate: Secondary | ICD-10-CM

## 2015-02-06 NOTE — Progress Notes (Signed)
  Radiation Oncology         [phone]   Name: Christopher Rivas MRN: [id number]   Date: 02/06/2015  DOB: [birth year]   Weekly Radiation Therapy Management    ICD-9-CM ICD-10-CM   1. Prostate cancer 185 C61     Current Dose: 15.6 Gy  Planned Dose:  78 Gy  Narrative The patient presents for routine under treatment assessment. Denies dysuria, hematuria, or diarrhea. Denies urgency or leakage. Reports nocturia x 3. Denies fatigue Set-up films were reviewed. The chart was checked.  Physical Findings  weight is 173 lb 8 oz (78.699 kg). His blood pressure is 135/89 and his pulse is 75. His respiration is 16. . Weight essentially stable.  No significant changes.  Impression The patient is tolerating radiation.  Plan Continue treatment as planned.      Sheral Apley Tammi Klippel, M.D.

## 2015-02-06 NOTE — Progress Notes (Signed)
Weight and vitals stable. Denies dysuria, hematuria, or diarrhea. Denies urgency or leakage. Reports nocturia x 3. Denies fatigue.  BP 135/89 mmHg  Pulse 75  Resp 16  Wt 173 lb 8 oz (78.699 kg) Wt Readings from Last 3 Encounters:  02/06/15 173 lb 8 oz (78.699 kg)  01/30/15 173 lb 3.2 oz (78.563 kg)  12/10/14 173 lb 12.8 oz (78.835 kg)

## 2015-02-09 ENCOUNTER — Ambulatory Visit
Admission: RE | Admit: 2015-02-09 | Discharge: 2015-02-09 | Disposition: A | Discharge status: Home / Self Care | Payer: Medicare HMO | Source: Ambulatory Visit | Attending: Radiation Oncology | Admitting: Radiation Oncology

## 2015-02-09 DIAGNOSIS — Z510 Encounter for antineoplastic radiation therapy: Secondary | ICD-10-CM | POA: Diagnosis not present

## 2015-02-10 ENCOUNTER — Ambulatory Visit: Admission: RE | Admit: 2015-02-10 | Payer: Medicare HMO | Source: Ambulatory Visit

## 2015-02-10 ENCOUNTER — Encounter: Payer: Self-pay | Admitting: Medical Oncology

## 2015-02-10 ENCOUNTER — Ambulatory Visit
Admission: RE | Admit: 2015-02-10 | Discharge: 2015-02-10 | Disposition: A | Discharge status: Home / Self Care | Payer: Medicare HMO | Source: Ambulatory Visit | Attending: Radiation Oncology | Admitting: Radiation Oncology

## 2015-02-10 DIAGNOSIS — Z510 Encounter for antineoplastic radiation therapy: Secondary | ICD-10-CM | POA: Diagnosis not present

## 2015-02-10 NOTE — Progress Notes (Signed)
Oncology Nurse Navigator Documentation  Oncology Nurse Navigator Flowsheets 01/28/2015 02/10/2015  Referral date to RadOnc/MedOnc 12/10/2014 -  Navigator Encounter Type Treatment Treatment- Christopher Rivas states he is tolerating his radiation well. No complaint with urination or fatigue. His sister has been able to bring him without any issues. I asked them to call me if they have any concerns or needs. They both voiced understanding.  Barriers/Navigation Needs Transportation -  Support Groups/Services Friends and Family -  Time Spent with Patient - (No Data)

## 2015-02-11 ENCOUNTER — Ambulatory Visit
Admission: RE | Admit: 2015-02-11 | Discharge: 2015-02-11 | Disposition: A | Discharge status: Home / Self Care | Payer: Medicare HMO | Source: Ambulatory Visit | Attending: Radiation Oncology | Admitting: Radiation Oncology

## 2015-02-11 DIAGNOSIS — Z510 Encounter for antineoplastic radiation therapy: Secondary | ICD-10-CM | POA: Diagnosis not present

## 2015-02-12 ENCOUNTER — Ambulatory Visit
Admission: RE | Admit: 2015-02-12 | Discharge: 2015-02-12 | Disposition: A | Discharge status: Home / Self Care | Payer: Medicare HMO | Source: Ambulatory Visit | Attending: Radiation Oncology | Admitting: Radiation Oncology

## 2015-02-12 DIAGNOSIS — Z510 Encounter for antineoplastic radiation therapy: Secondary | ICD-10-CM | POA: Diagnosis not present

## 2015-02-13 ENCOUNTER — Ambulatory Visit
Admission: RE | Admit: 2015-02-13 | Discharge: 2015-02-13 | Disposition: A | Discharge status: Home / Self Care | Payer: Medicare HMO | Source: Ambulatory Visit | Attending: Radiation Oncology | Admitting: Radiation Oncology

## 2015-02-13 ENCOUNTER — Encounter: Payer: Self-pay | Admitting: Radiation Oncology

## 2015-02-13 VITALS — BP 136/75 | HR 74 | Resp 16 | Wt 172.1 lb

## 2015-02-13 DIAGNOSIS — C61 Malignant neoplasm of prostate: Secondary | ICD-10-CM

## 2015-02-13 DIAGNOSIS — Z510 Encounter for antineoplastic radiation therapy: Secondary | ICD-10-CM | POA: Diagnosis not present

## 2015-02-13 NOTE — Progress Notes (Signed)
Weight and vitals stable. Denies pain. Reports nocturia x 1. Denies fatigue. Denies diarrhea. Denies dysuria, hematuria or incontinence.   BP 136/75 mmHg  Pulse 74  Resp 16  Wt 172 lb 1.6 oz (78.064 kg) Wt Readings from Last 3 Encounters:  02/13/15 172 lb 1.6 oz (78.064 kg)  02/06/15 173 lb 8 oz (78.699 kg)  01/30/15 173 lb 3.2 oz (78.563 kg)

## 2015-02-13 NOTE — Progress Notes (Signed)
  Radiation Oncology         [phone]   Name: Christopher Rivas MRN: [id number]   Date: 02/13/2015  DOB: 07-Dec-1950     Weekly Radiation Therapy Management    ICD-9-CM ICD-10-CM   1. Prostate cancer 185 C61     Current Dose: 25.35 Gy  Planned Dose:  78 Gy  Narrative The patient presents for routine under treatment assessment. Weight and vitals stable. Denies pain. Reports nocturia x 1. Denies fatigue. Denies diarrhea. Denies dysuria, hematuria or incontinence. Patient is without complaints. Set-up films were reviewed. The chart was checked.  Physical Findings  weight is 172 lb 1.6 oz (78.064 kg). His blood pressure is 136/75 and his pulse is 74. His respiration is 16. . Weight essentially stable.  No significant changes.  Impression The patient is tolerating radiation.  Plan Continue treatment as planned.   This document serves as a record of services personally performed by Tyler Pita, MD. It was created on his behalf by Arlyce Harman, a trained medical scribe. The creation of this record is based on the scribe's personal observations and the provider's statements to them. This document has been checked and approved by the attending provider.      Christopher Rivas, M.D.

## 2015-02-16 ENCOUNTER — Ambulatory Visit
Admission: RE | Admit: 2015-02-16 | Discharge: 2015-02-16 | Disposition: A | Discharge status: Home / Self Care | Payer: Medicare HMO | Source: Ambulatory Visit | Attending: Radiation Oncology | Admitting: Radiation Oncology

## 2015-02-16 DIAGNOSIS — Z510 Encounter for antineoplastic radiation therapy: Secondary | ICD-10-CM | POA: Diagnosis not present

## 2015-02-17 ENCOUNTER — Ambulatory Visit
Admission: RE | Admit: 2015-02-17 | Discharge: 2015-02-17 | Disposition: A | Discharge status: Home / Self Care | Payer: Medicare HMO | Source: Ambulatory Visit | Attending: Radiation Oncology | Admitting: Radiation Oncology

## 2015-02-17 DIAGNOSIS — Z510 Encounter for antineoplastic radiation therapy: Secondary | ICD-10-CM | POA: Diagnosis not present

## 2015-02-18 ENCOUNTER — Ambulatory Visit
Admission: RE | Admit: 2015-02-18 | Discharge: 2015-02-18 | Disposition: A | Discharge status: Home / Self Care | Payer: Medicare HMO | Source: Ambulatory Visit | Attending: Radiation Oncology | Admitting: Radiation Oncology

## 2015-02-18 DIAGNOSIS — Z510 Encounter for antineoplastic radiation therapy: Secondary | ICD-10-CM | POA: Diagnosis not present

## 2015-02-19 ENCOUNTER — Ambulatory Visit
Admission: RE | Admit: 2015-02-19 | Discharge: 2015-02-19 | Disposition: A | Discharge status: Home / Self Care | Payer: Medicare HMO | Source: Ambulatory Visit | Attending: Radiation Oncology | Admitting: Radiation Oncology

## 2015-02-19 ENCOUNTER — Encounter: Payer: Self-pay | Admitting: Medical Oncology

## 2015-02-19 ENCOUNTER — Encounter: Payer: Self-pay | Admitting: Radiation Oncology

## 2015-02-19 VITALS — BP 118/74 | HR 70 | Temp 98.0°F | Ht 68.0 in | Wt 174.0 lb

## 2015-02-19 DIAGNOSIS — Z510 Encounter for antineoplastic radiation therapy: Secondary | ICD-10-CM | POA: Diagnosis not present

## 2015-02-19 DIAGNOSIS — C61 Malignant neoplasm of prostate: Secondary | ICD-10-CM

## 2015-02-19 NOTE — Progress Notes (Signed)
Oncology Nurse Navigator Documentation  Oncology Nurse Navigator Flowsheets 01/28/2015 02/10/2015 02/19/2015  Referral date to RadOnc/MedOnc 12/10/2014 - -  Navigator Encounter Type Treatment Treatment Treatment- Mr. Letizia doing well with radiation. Has some stinging and burning with urination. I met another one of his sisters today.I introduced myself and my role.His sister Mardene Celeste is working today.I asked if they were having any issues with transportation. She states not at this time. She has been able to be here the days that Mardene Celeste has not been able to bring him.   Patient Visit Type - - Radonc  Treatment Phase - - Treatment  Barriers/Navigation Needs Transportation - No barriers at this time  Support Groups/Services Friends and Family - -  Time Spent with Patient - (No Data) 15

## 2015-02-19 NOTE — Progress Notes (Signed)
  Radiation Oncology         [phone]   Name: Christopher Rivas MRN: [id number]   Date: 02/19/2015  DOB: 1950/08/01     Weekly Radiation Therapy Management  Diagnosis: Christopher Rivas is a 64 year old gentleman presenting to clinic in regards to his cancer of the prostate.  Current Dose: 33.15 Gy  Planned Dose:  78 Gy  Narrative The patient presents for routine under treatment assessment and has completed 17 of 40 treatments at this time. He currently denies symptoms of pain, fatigue, poor appetite, weakness, loss of sleep, and poor appetite. The patient also reports a stinging sensation at end of urinary stream with nocturia X2. Soft stool and passing "a lot of gas" is an issue. Set-up films were reviewed and the chart was checked.  Physical Findings  height is 5\' 8"  (1.727 m) and weight is 174 lb (78.926 kg). His temperature is 98 F (36.7 C). His blood pressure is 118/74 and his pulse is 70. Marland Kitchen His weight is essentially stable with no significant changes in overall health to be noted at this time.  Impression Christopher Rivas is a 64 year old gentleman presenting to clinic in regards to his cancer of the prostate. The patient is tolerating radiation well.  Plan The patient was advised to continue treatment as planned. Common symptoms to be expected at this point in treatment were discussed and healthy methods of management were reviewed. Instructions towards management of his new symptom of loose stool were addressed. If he develops any additional questions or concerns, he was encouraged to contact Dr. Tammi Klippel, MD.   This document serves as a record of services personally performed by Tyler Pita, MD. It was created on his behalf by Lenn Cal, a trained medical scribe. The creation of this record is based on the scribe's personal observations and the provider's statements to them. This document has been checked and approved by the attending  provider.  __________________________________________   Sheral Apley. Tammi Klippel, M.D.

## 2015-02-19 NOTE — Progress Notes (Signed)
PAIN: He is currently in no pain. Stinging sensation at end of urinary stream over N/A. URINARY: Pt reports urinary pain with urination and dribbling. Pt states they urinate 1 - 2 times per night.  BOWEL: Pt reports Diarrhea  1 soft stools.  Passing "a lot of gas". OTHER: Pt complains of Denies any fatigue, weakness, loss of sleep and poor appetite, fatigue, weakness, loss of sleep and poor appetite BP 118/74 mmHg  Pulse 70  Temp(Src) 98 F (36.7 C)  Ht 5\' 8"  (1.727 m)  Wt 174 lb (78.926 kg)  BMI 26.46 kg/m2 Wt Readings from Last 3 Encounters:  02/19/15 174 lb (78.926 kg)  02/13/15 172 lb 1.6 oz (78.064 kg)  02/06/15 173 lb 8 oz (78.699 kg)

## 2015-02-20 ENCOUNTER — Ambulatory Visit
Admission: RE | Admit: 2015-02-20 | Discharge: 2015-02-20 | Disposition: A | Discharge status: Home / Self Care | Payer: Medicare HMO | Source: Ambulatory Visit | Attending: Radiation Oncology | Admitting: Radiation Oncology

## 2015-02-20 DIAGNOSIS — Z510 Encounter for antineoplastic radiation therapy: Secondary | ICD-10-CM | POA: Diagnosis not present

## 2015-02-23 ENCOUNTER — Ambulatory Visit
Admission: RE | Admit: 2015-02-23 | Discharge: 2015-02-23 | Disposition: A | Discharge status: Home / Self Care | Payer: Medicare HMO | Source: Ambulatory Visit | Attending: Radiation Oncology | Admitting: Radiation Oncology

## 2015-02-23 DIAGNOSIS — Z510 Encounter for antineoplastic radiation therapy: Secondary | ICD-10-CM | POA: Diagnosis not present

## 2015-02-24 ENCOUNTER — Ambulatory Visit
Admission: RE | Admit: 2015-02-24 | Discharge: 2015-02-24 | Disposition: A | Discharge status: Home / Self Care | Payer: Medicare HMO | Source: Ambulatory Visit | Attending: Radiation Oncology | Admitting: Radiation Oncology

## 2015-02-24 DIAGNOSIS — Z510 Encounter for antineoplastic radiation therapy: Secondary | ICD-10-CM | POA: Diagnosis not present

## 2015-02-25 ENCOUNTER — Ambulatory Visit
Admission: RE | Admit: 2015-02-25 | Discharge: 2015-02-25 | Disposition: A | Discharge status: Home / Self Care | Payer: Medicare HMO | Source: Ambulatory Visit | Attending: Radiation Oncology | Admitting: Radiation Oncology

## 2015-02-25 DIAGNOSIS — Z510 Encounter for antineoplastic radiation therapy: Secondary | ICD-10-CM | POA: Diagnosis not present

## 2015-02-26 ENCOUNTER — Ambulatory Visit
Admission: RE | Admit: 2015-02-26 | Discharge: 2015-02-26 | Disposition: A | Discharge status: Home / Self Care | Payer: Medicare HMO | Source: Ambulatory Visit | Attending: Radiation Oncology | Admitting: Radiation Oncology

## 2015-02-26 DIAGNOSIS — Z510 Encounter for antineoplastic radiation therapy: Secondary | ICD-10-CM | POA: Diagnosis not present

## 2015-02-27 ENCOUNTER — Ambulatory Visit
Admission: RE | Admit: 2015-02-27 | Discharge: 2015-02-27 | Disposition: A | Discharge status: Home / Self Care | Payer: Medicare HMO | Source: Ambulatory Visit | Attending: Radiation Oncology | Admitting: Radiation Oncology

## 2015-02-27 DIAGNOSIS — Z510 Encounter for antineoplastic radiation therapy: Secondary | ICD-10-CM | POA: Diagnosis not present

## 2015-03-02 ENCOUNTER — Encounter: Payer: Self-pay | Admitting: Radiation Oncology

## 2015-03-02 ENCOUNTER — Ambulatory Visit
Admission: RE | Admit: 2015-03-02 | Discharge: 2015-03-02 | Disposition: A | Discharge status: Home / Self Care | Payer: Medicare HMO | Source: Ambulatory Visit | Attending: Radiation Oncology | Admitting: Radiation Oncology

## 2015-03-02 DIAGNOSIS — Z510 Encounter for antineoplastic radiation therapy: Secondary | ICD-10-CM | POA: Diagnosis not present

## 2015-03-03 ENCOUNTER — Encounter: Payer: Self-pay | Admitting: Radiation Oncology

## 2015-03-03 ENCOUNTER — Ambulatory Visit
Admission: RE | Admit: 2015-03-03 | Discharge: 2015-03-03 | Disposition: A | Discharge status: Home / Self Care | Payer: Medicare HMO | Source: Ambulatory Visit | Attending: Radiation Oncology | Admitting: Radiation Oncology

## 2015-03-03 ENCOUNTER — Encounter: Payer: Self-pay | Admitting: Medical Oncology

## 2015-03-03 VITALS — BP 140/82 | HR 79 | Resp 16 | Wt 175.2 lb

## 2015-03-03 DIAGNOSIS — C61 Malignant neoplasm of prostate: Secondary | ICD-10-CM

## 2015-03-03 DIAGNOSIS — Z510 Encounter for antineoplastic radiation therapy: Secondary | ICD-10-CM | POA: Diagnosis not present

## 2015-03-03 NOTE — Progress Notes (Addendum)
Weekly Management Note Current Dose:  48.75 Gy  Projected Dose: 70 Gy   Narrative:  The patient presents for routine under treatment assessment.  CBCT/MVCT images/Port film x-rays were reviewed.  The chart was checked. Bowels much better with immodium. Less strain/easier urine flow. Appreciative of staff.   Physical Findings: Weight: 175 lb 3.2 oz (79.47 kg). Unchanged. Alert and oriented.   Impression:  The patient is tolerating radiation.  Plan:  Continue treatment as planned. Continue immodium as needed.

## 2015-03-03 NOTE — Progress Notes (Signed)
Weight and vitals stable. Denies pain. Reports dysuria is less this week than last due to increased fluid intake. Reports episodes of diarrhea are less with the aid of Imodium. Reports nocturia x 4. Reports urine stream seems stronger. Denies fatigue.  BP 140/82 mmHg  Pulse 79  Resp 16  Wt 175 lb 3.2 oz (79.47 kg) Wt Readings from Last 3 Encounters:  03/03/15 175 lb 3.2 oz (79.47 kg)  02/19/15 174 lb (78.926 kg)  02/13/15 172 lb 1.6 oz (78.064 kg)

## 2015-03-04 ENCOUNTER — Ambulatory Visit
Admission: RE | Admit: 2015-03-04 | Discharge: 2015-03-04 | Disposition: A | Discharge status: Home / Self Care | Payer: Medicare HMO | Source: Ambulatory Visit | Attending: Radiation Oncology | Admitting: Radiation Oncology

## 2015-03-04 DIAGNOSIS — Z510 Encounter for antineoplastic radiation therapy: Secondary | ICD-10-CM | POA: Diagnosis not present

## 2015-03-05 ENCOUNTER — Encounter: Payer: Self-pay | Admitting: Radiation Oncology

## 2015-03-05 ENCOUNTER — Ambulatory Visit
Admission: RE | Admit: 2015-03-05 | Discharge: 2015-03-05 | Disposition: A | Discharge status: Home / Self Care | Payer: Medicare HMO | Source: Ambulatory Visit | Attending: Radiation Oncology | Admitting: Radiation Oncology

## 2015-03-05 VITALS — BP 154/88 | HR 75 | Resp 16 | Wt 173.8 lb

## 2015-03-05 DIAGNOSIS — C61 Malignant neoplasm of prostate: Secondary | ICD-10-CM

## 2015-03-05 DIAGNOSIS — Z510 Encounter for antineoplastic radiation therapy: Secondary | ICD-10-CM | POA: Diagnosis not present

## 2015-03-05 NOTE — Progress Notes (Signed)
  Radiation Oncology         [phone]   Name: Christopher Rivas MRN: [id number]   Date: 03/05/2015  DOB: January 05, 1951     Weekly Radiation Therapy Management  Diagnosis: Christopher Rivas is a 64 year old gentleman presenting to clinic in regards to his cancer of the prostate.  Current Dose: 52.65 Gy Planned Dose:  78 Gy  Narrative The patient presents for routine under treatment assessment and has completed 27 of 40 treatments at this time. Denies pain. Reports dysuria is less this week. Reports diarrhea is managed with Imodium. Reports nocturia x 4. Reports urine stream seems stronger. Reports fatigue. The pt has no other complaints or questions at this time. Set-up films were reviewed and the chart was checked.  Physical Findings  weight is 173 lb 12.8 oz (78.835 kg). His blood pressure is 154/88 and his pulse is 75. His respiration is 16. Marland Kitchen His weight is essentially stable with no significant changes in overall health to be noted at this time.  Impression Christopher Rivas is a 64 year old gentleman presenting to clinic in regards to his cancer of the prostate. The patient is tolerating radiation well.  Plan The patient was advised to continue treatment as planned. Common symptoms to be expected at this point in treatment were discussed and healthy methods of management were reviewed. Instructions towards management of his new symptom of loose stool were addressed. If he develops any additional questions or concerns, he was encouraged to contact Dr. Tammi Klippel, MD.   This document serves as a record of services personally performed by Tyler Pita, MD. It was created on his behalf by Darcus Austin, a trained medical scribe. The creation of this record is based on the scribe's personal observations and the provider's statements to them. This document has been checked and approved by the attending provider.     __________________________________________   Sheral Apley. Tammi Klippel, M.D.

## 2015-03-05 NOTE — Progress Notes (Signed)
Weight and vitals stable. Denies pain. Reports dysuria is less this week. Reports diarrhea is managed with Imodium. Reports nocturia x 4. Reports urine stream seems stronger. Reports fatigue.   BP 154/88 mmHg  Pulse 75  Resp 16  Wt 173 lb 12.8 oz (78.835 kg) Wt Readings from Last 3 Encounters:  03/05/15 173 lb 12.8 oz (78.835 kg)  03/03/15 175 lb 3.2 oz (79.47 kg)  02/19/15 174 lb (78.926 kg)

## 2015-03-06 ENCOUNTER — Ambulatory Visit
Admission: RE | Admit: 2015-03-06 | Discharge: 2015-03-06 | Disposition: A | Discharge status: Home / Self Care | Payer: Medicare HMO | Source: Ambulatory Visit | Attending: Radiation Oncology | Admitting: Radiation Oncology

## 2015-03-06 DIAGNOSIS — Z510 Encounter for antineoplastic radiation therapy: Secondary | ICD-10-CM | POA: Diagnosis not present

## 2015-03-09 ENCOUNTER — Ambulatory Visit
Admission: RE | Admit: 2015-03-09 | Discharge: 2015-03-09 | Disposition: A | Discharge status: Home / Self Care | Payer: Medicare HMO | Source: Ambulatory Visit | Attending: Radiation Oncology | Admitting: Radiation Oncology

## 2015-03-09 DIAGNOSIS — Z510 Encounter for antineoplastic radiation therapy: Secondary | ICD-10-CM | POA: Diagnosis not present

## 2015-03-10 ENCOUNTER — Ambulatory Visit
Admission: RE | Admit: 2015-03-10 | Discharge: 2015-03-10 | Disposition: A | Discharge status: Home / Self Care | Payer: Medicare HMO | Source: Ambulatory Visit | Attending: Radiation Oncology | Admitting: Radiation Oncology

## 2015-03-10 DIAGNOSIS — Z510 Encounter for antineoplastic radiation therapy: Secondary | ICD-10-CM | POA: Diagnosis not present

## 2015-03-10 DIAGNOSIS — C61 Malignant neoplasm of prostate: Secondary | ICD-10-CM | POA: Diagnosis not present

## 2015-03-11 ENCOUNTER — Ambulatory Visit
Admission: RE | Admit: 2015-03-11 | Discharge: 2015-03-11 | Disposition: A | Discharge status: Home / Self Care | Payer: Medicare HMO | Source: Ambulatory Visit | Attending: Radiation Oncology | Admitting: Radiation Oncology

## 2015-03-11 DIAGNOSIS — Z510 Encounter for antineoplastic radiation therapy: Secondary | ICD-10-CM | POA: Diagnosis not present

## 2015-03-12 ENCOUNTER — Ambulatory Visit
Admission: RE | Admit: 2015-03-12 | Discharge: 2015-03-12 | Disposition: A | Discharge status: Home / Self Care | Payer: Medicare HMO | Source: Ambulatory Visit | Attending: Radiation Oncology | Admitting: Radiation Oncology

## 2015-03-12 DIAGNOSIS — Z510 Encounter for antineoplastic radiation therapy: Secondary | ICD-10-CM | POA: Diagnosis not present

## 2015-03-13 ENCOUNTER — Ambulatory Visit
Admission: RE | Admit: 2015-03-13 | Discharge: 2015-03-13 | Disposition: A | Discharge status: Home / Self Care | Payer: Medicare HMO | Source: Ambulatory Visit | Attending: Radiation Oncology | Admitting: Radiation Oncology

## 2015-03-13 ENCOUNTER — Encounter: Payer: Self-pay | Admitting: Radiation Oncology

## 2015-03-13 VITALS — BP 127/79 | HR 71 | Resp 16 | Wt 175.2 lb

## 2015-03-13 DIAGNOSIS — C61 Malignant neoplasm of prostate: Secondary | ICD-10-CM

## 2015-03-13 DIAGNOSIS — Z510 Encounter for antineoplastic radiation therapy: Secondary | ICD-10-CM | POA: Diagnosis not present

## 2015-03-13 NOTE — Progress Notes (Signed)
  Radiation Oncology         [phone]   Name: Christopher Rivas MRN: [id number]   Date: 03/13/2015  DOB: [birth year]     Weekly Radiation Therapy Management  Diagnosis: Christopher Rivas is a 64 year old gentleman presenting to clinic in regards to his cancer of the prostate.  Current Dose: 64.35 Gy Planned Dose:  78 Gy  Narrative The patient presents for routine under treatment assessment. Weight and vitals stable. Denies pain. Reports nocturia x 3. Reports mild dysuria. Denies diarrhea. Denies fatigue. Denies urinary leakage or incontinence. Denies hematuria. The pt has no other complaints or questions at this time. Set-up films were reviewed and the chart was checked.  Physical Findings  weight is 175 lb 3.2 oz (79.47 kg). His blood pressure is 127/79 and his pulse is 71. His respiration is 16. Marland Kitchen His weight is essentially stable with no significant changes in overall health to be noted at this time.  Impression Christopher Rivas is a 64 year old gentleman presenting to clinic in regards to his cancer of the prostate. The patient is tolerating radiation well.  Plan The patient was advised to continue treatment as planned.    This document serves as a record of services personally performed by Tyler Pita, MD. It was created on his behalf by Arlyce Harman, a trained medical scribe. The creation of this record is based on the scribe's personal observations and the provider's statements to them. This document has been checked and approved by the attending provider.    __________________________________________   Sheral Apley. Tammi Klippel, M.D.

## 2015-03-13 NOTE — Progress Notes (Signed)
Weight and vitals stable. Denies pain. Reports nocturia x 3. Reports mild dysuria. Denies diarrhea. Denies fatigue. Denies urinary leakage or incontinence. Denies hematuria.  BP 127/79 mmHg  Pulse 71  Resp 16  Wt 175 lb 3.2 oz (79.47 kg) Wt Readings from Last 3 Encounters:  03/13/15 175 lb 3.2 oz (79.47 kg)  03/05/15 173 lb 12.8 oz (78.835 kg)  03/03/15 175 lb 3.2 oz (79.47 kg)

## 2015-03-16 ENCOUNTER — Ambulatory Visit
Admission: RE | Admit: 2015-03-16 | Discharge: 2015-03-16 | Disposition: A | Discharge status: Home / Self Care | Payer: Medicare HMO | Source: Ambulatory Visit | Attending: Radiation Oncology | Admitting: Radiation Oncology

## 2015-03-16 DIAGNOSIS — Z510 Encounter for antineoplastic radiation therapy: Secondary | ICD-10-CM | POA: Diagnosis not present

## 2015-03-17 ENCOUNTER — Ambulatory Visit
Admission: RE | Admit: 2015-03-17 | Discharge: 2015-03-17 | Disposition: A | Discharge status: Home / Self Care | Payer: Medicare HMO | Source: Ambulatory Visit | Attending: Radiation Oncology | Admitting: Radiation Oncology

## 2015-03-17 DIAGNOSIS — Z510 Encounter for antineoplastic radiation therapy: Secondary | ICD-10-CM | POA: Diagnosis not present

## 2015-03-18 ENCOUNTER — Ambulatory Visit
Admission: RE | Admit: 2015-03-18 | Discharge: 2015-03-18 | Disposition: A | Discharge status: Home / Self Care | Payer: Medicare HMO | Source: Ambulatory Visit | Attending: Radiation Oncology | Admitting: Radiation Oncology

## 2015-03-18 DIAGNOSIS — Z510 Encounter for antineoplastic radiation therapy: Secondary | ICD-10-CM | POA: Diagnosis not present

## 2015-03-19 ENCOUNTER — Ambulatory Visit
Admission: RE | Admit: 2015-03-19 | Discharge: 2015-03-19 | Disposition: A | Discharge status: Home / Self Care | Payer: Medicare HMO | Source: Ambulatory Visit | Attending: Radiation Oncology | Admitting: Radiation Oncology

## 2015-03-19 DIAGNOSIS — Z510 Encounter for antineoplastic radiation therapy: Secondary | ICD-10-CM | POA: Diagnosis not present

## 2015-03-20 ENCOUNTER — Ambulatory Visit
Admission: RE | Admit: 2015-03-20 | Discharge: 2015-03-20 | Disposition: A | Discharge status: Home / Self Care | Payer: Medicare HMO | Source: Ambulatory Visit | Attending: Radiation Oncology | Admitting: Radiation Oncology

## 2015-03-20 VITALS — BP 131/78 | HR 75 | Resp 16 | Wt 172.2 lb

## 2015-03-20 DIAGNOSIS — Z510 Encounter for antineoplastic radiation therapy: Secondary | ICD-10-CM | POA: Diagnosis not present

## 2015-03-20 DIAGNOSIS — C61 Malignant neoplasm of prostate: Secondary | ICD-10-CM

## 2015-03-20 NOTE — Progress Notes (Addendum)
Weight and vitals stable. Denies pain. Reports nocturia x 3. Reports he is managing diarrhea with Imodium AD. Reports mild dysuria. Denies leakage or incontinence. Report his wife denies seeing any hematuria. Denies fatigue. One month follow up appointment card given. Patient completes on Tuesday.  BP 131/78 mmHg  Pulse 75  Resp 16  Wt 169 lb (76.658 kg) Wt Readings from Last 3 Encounters:  03/20/15 172 lb 3.2 oz (78.109 kg)  03/13/15 175 lb 3.2 oz (79.47 kg)  03/05/15 173 lb 12.8 oz (78.835 kg)

## 2015-03-20 NOTE — Progress Notes (Signed)
  Radiation Oncology         [phone]   Name: Christopher Rivas MRN: [id number]   Date: 03/20/2015  DOB: April 12, 1951     Weekly Radiation Therapy Management  Diagnosis: Christopher Rivas is a 64 year old gentleman presenting to clinic in regards to his cancer of the prostate.  Current Dose: 74.1 Gy Planned Dose:  78 Gy  Narrative The patient presents for routine under treatment assessment. Weight and vitals stable. Denies pain. Reports nocturia x 3. Reports he is managing diarrhea with Imodium AD. Reports mild dysuria. Denies leakage or incontinence. Report his wife denies seeing any hematuria. Denies fatigue. One month follow up appointment card given. Patient completes on Tuesday. Set-up films were reviewed and the chart was checked.  Physical Findings  weight is 172 lb 3.2 oz (78.109 kg). His blood pressure is 131/78 and his pulse is 75. His respiration is 16. Marland Kitchen His weight is essentially stable with no significant changes in overall health to be noted at this time.  Impression Christopher Rivas is a 64 year old gentleman presenting to clinic in regards to his cancer of the prostate. The patient is tolerating radiation well.  Plan The patient was advised to continue treatment as planned.  Will finish with treatment Tuesday. Follow up in 1 month      This document serves as a record of services personally performed by Tyler Pita, MD. It was created on his behalf by Janace Hoard, a trained medical scribe. The creation of this record is based on the scribe's personal observations and the provider's statements to them. This document has been checked and approved by the attending provider.  .    __________________________________________   Sheral Apley Tammi Klippel, M.D.

## 2015-03-23 ENCOUNTER — Ambulatory Visit
Admission: RE | Admit: 2015-03-23 | Discharge: 2015-03-23 | Disposition: A | Discharge status: Home / Self Care | Payer: Medicare HMO | Source: Ambulatory Visit | Attending: Radiation Oncology | Admitting: Radiation Oncology

## 2015-03-23 DIAGNOSIS — C61 Malignant neoplasm of prostate: Secondary | ICD-10-CM | POA: Diagnosis not present

## 2015-03-23 DIAGNOSIS — Z510 Encounter for antineoplastic radiation therapy: Secondary | ICD-10-CM | POA: Insufficient documentation

## 2015-03-24 ENCOUNTER — Ambulatory Visit
Admission: RE | Admit: 2015-03-24 | Discharge: 2015-03-24 | Disposition: A | Discharge status: Home / Self Care | Payer: Medicare HMO | Source: Ambulatory Visit | Attending: Radiation Oncology | Admitting: Radiation Oncology

## 2015-03-24 ENCOUNTER — Encounter: Payer: Self-pay | Admitting: Medical Oncology

## 2015-03-24 ENCOUNTER — Encounter: Payer: Self-pay | Admitting: Radiation Oncology

## 2015-03-24 DIAGNOSIS — Z510 Encounter for antineoplastic radiation therapy: Secondary | ICD-10-CM | POA: Diagnosis not present

## 2015-03-24 NOTE — Progress Notes (Signed)
Oncology Nurse Navigator Documentation  Oncology Nurse Navigator Flowsheets 02/19/2015 03/03/2015 03/24/2015  Referral date to RadOnc/MedOnc - - -  Navigator Encounter Type Treatment Treatment Treatment- I celebrated with Christopher Rivas and his sister as he rang the bell for the completion of his radiation treatments. He will follow up with Dr. Tammi Klippel 9/29. I asked them to call me with any questions or concerns. They voiced understanding.  Patient Visit Type Radonc Radonc Radonc  Treatment Phase Treatment Treatment Final Radiation Tx  Barriers/Navigation Needs No barriers at this time No barriers at this time No barriers at this time  Support Groups/Services - Friends and Family Friends and Family  Time Spent with Patient 83 29 19

## 2015-03-31 NOTE — Progress Notes (Signed)
  Radiation Oncology         [phone] ________________________________  Name: GRADYN SHEIN MRN: [id number]  Date: 03/24/2015  DOB: 09-Nov-1950  End of Treatment Note    ICD-9-CM ICD-10-CM   1. Prostate cancer 185 C61     DIAGNOSIS: 64 y.o. gentleman with stage T1c adenocarcinoma of the prostate with a Gleason's score of 3+4 and a PSA of 11.27     Indication for treatment:  Curative, Definitive Radiotherapy       Radiation treatment dates:   01/28/2015-03/24/2015  Site/dose:   The prostate was treated to 78 Gy in 40 fractions of 1.95 Gy  Beams/energy:   The patient was treated with IMRT using volumetric arc therapy delivering 6 MV X-rays to clockwise and counterclockwise circumferential arcs with a 90 degree collimator offset to avoid dose scalloping.  Image guidance was performed with daily cone beam CT prior to each fraction to align to gold markers in the prostate and assure proper bladder and rectal fill volumes.  Immobilization was achieved with [employer] custom mold.  Narrative: The patient tolerated radiation treatment relatively well.   The patient experienced some minor urinary irritation and modest fatigue.   Plan: The patient has completed radiation treatment. He will return to radiation oncology clinic for routine followup in one month. I advised him to call or return sooner if he has any questions or concerns related to his recovery or treatment. ________________________________  Sheral Apley. Tammi Klippel, M.D.

## 2015-04-23 ENCOUNTER — Encounter: Payer: Self-pay | Admitting: Radiation Oncology

## 2015-04-23 ENCOUNTER — Ambulatory Visit
Admission: RE | Admit: 2015-04-23 | Discharge: 2015-04-23 | Disposition: A | Discharge status: Home / Self Care | Payer: Medicare HMO | Source: Ambulatory Visit | Attending: Radiation Oncology | Admitting: Radiation Oncology

## 2015-04-23 VITALS — BP 154/88 | HR 83 | Resp 16 | Wt 176.1 lb

## 2015-04-23 DIAGNOSIS — C61 Malignant neoplasm of prostate: Secondary | ICD-10-CM

## 2015-04-23 NOTE — Progress Notes (Signed)
Radiation Oncology         [id number]) [phone] ________________________________  Name: Christopher Rivas MRN: [id number]  Date: 04/23/2015  DOB: 09/02/1950    Follow-Up Visit Note  CC: PROVIDER NOT IN SYSTEM  Lowella Bandy, MD  Diagnosis:   64 y.o. gentleman with stage T1c adenocarcinoma of the prostate with a Gleason's score of 3+4 and a PSA of 11.27    ICD-9-CM ICD-10-CM   1. Prostate cancer 185 C61     Interval Since Last Radiation:  1  months  Narrative:  The patient returns today for routine follow-up.  Weight and vitals stable. Denies pain. Reports nocturia x 3. Reports diarrhea has resolved. Reports dysuria has resolved. Denies leakage or incontinence. Report his wife denies seeing any hematuria. Reports his energy level is greatly improving. Walks with the aid of a cane. 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 He does not currently have an appointment scheduled with urology.                               ALLERGIES:  has No Known Allergies.  Meds: Current Outpatient Prescriptions  Medication Sig Dispense Refill  . ACCU-CHEK AVIVA PLUS test strip 2 (two) times daily. for testing  1  . Alogliptin-Pioglitazone (OSENI) 25-15 MG TABS Take 1 tablet by mouth daily.    Marland Kitchen amLODipine (NORVASC) 10 MG tablet Take 10 mg by mouth daily.    . brimonidine-timolol (COMBIGAN) 0.2-0.5 % ophthalmic solution Place 1 drop into both eyes 2 (two) times daily.    . dorzolamide (TRUSOPT) 2 % ophthalmic solution Place 1 drop into both eyes 3 (three) times daily.    . dorzolamide (TRUSOPT) 2 % ophthalmic solution Apply to eye.    . fluorometholone (FML FORTE) 0.25 % ophthalmic suspension Apply to eye.    . gabapentin (NEURONTIN) 300 MG capsule   0  . hydrochlorothiazide (HYDRODIURIL) 25 MG tablet Take 25 mg by mouth daily.    Marland Kitchen LEVEMIR FLEXTOUCH 100 UNIT/ML Pen   0  . lisinopril (PRINIVIL,ZESTRIL) 10 MG tablet Take 10 mg by mouth daily.    Marland Kitchen loteprednol (LOTEMAX) 0.5 % ophthalmic suspension Place 1 drop into both  eyes 3 (three) times daily.    Marland Kitchen lovastatin (MEVACOR) 20 MG tablet Take by mouth.    . metFORMIN (GLUCOPHAGE) 500 MG tablet Take 500 mg by mouth 2 (two) times daily with a meal.    . NOVOFINE 32G X 6 MM MISC   0  . pioglitazone (ACTOS) 15 MG tablet Take by mouth.    . prednisoLONE acetate (PRED FORTE) 1 % ophthalmic suspension Apply to eye.    . sitaGLIPtin-metformin (JANUMET) 50-1000 MG per tablet Take by mouth.     No current facility-administered medications for this encounter.    Physical Findings: The patient is in no acute distress. Patient is alert and oriented.  weight is 176 lb 1.6 oz (79.878 kg). His blood pressure is 154/88 and his pulse is 83. His respiration is 16 and oxygen saturation is 100%. .  No significant changes.    Impression:  The patient is recovering from the effects of radiation.    Plan:  He will continue to follow-up with urology for ongoing PSA determinations.  I will look forward to following his response through their correspondence, and be happy to participate in care if clinically indicated.  I talked to the patient about what to expect in the future, including his risk for  erectile dysfunction and rectal bleeding.  I encouraged him to call or return to the office if he has any question about his previous radiation or possible radiation effects.  He was comfortable with this plan.   This document serves as a record of services personally performed by Tyler Pita, MD. It was created on his behalf by Arlyce Harman, a trained medical scribe. The creation of this record is based on the scribe's personal observations and the provider's statements to them. This document has been checked and approved by the attending provider.    _____________________________________  Sheral Apley. Tammi Klippel, M.D.

## 2015-04-23 NOTE — Progress Notes (Signed)
Weight and vitals stable. Denies pain. Reports nocturia x 3. Reports diarrhea has resolved. Reports dysuria has resolved. Denies leakage or incontinence. Report his wife denies seeing any hematuria. Reports his energy level is greatly improving. Walks with the aid of a cane. 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   BP 154/88 mmHg  Pulse 83  Resp 16  Wt 176 lb 1.6 oz (79.878 kg)  SpO2 100% Wt Readings from Last 3 Encounters:  04/23/15 176 lb 1.6 oz (79.878 kg)  03/20/15 172 lb 3.2 oz (78.109 kg)  03/13/15 175 lb 3.2 oz (79.47 kg)

## 2015-04-24 ENCOUNTER — Telehealth: Payer: Self-pay | Admitting: *Deleted

## 2015-04-24 NOTE — Telephone Encounter (Signed)
CALLED PATIENT TO INFORM OF FU WITH DR. Cassandria Anger @ [facility] ON 05-19-15 - ARRIVAL TIME - 3:30 PM, SPOKE WITH PATIENT AND HE IS AWARE OF THIS APPT.

## 2015-06-23 ENCOUNTER — Telehealth: Payer: Self-pay | Admitting: Medical Oncology

## 2015-06-23 NOTE — Telephone Encounter (Signed)
Oncology Nurse Navigator Documentation  Oncology Nurse Navigator Flowsheets 03/03/2015 03/24/2015 06/23/2015  Referral date to RadOnc/MedOnc - - -  Navigator Encounter Type Treatment Treatment Telephone;3 month-Christopher Rivas states he is doing well. Right after his finished the radiation he has stinging with urination but this has all subsided. His energy is good and no complaints. He follows up with his urologist  December 20 and will have his PSA checked. I asked him to call me with any questions or concerns. He voiced understanding.  Patient Visit Type Radonc Radonc -  Treatment Phase Treatment Final Radiation Tx -  Barriers/Navigation Needs No barriers at this time No barriers at this time No barriers at this time  Support Groups/Services Friends and Family Friends and Family -  Time Spent with Patient [id number]

## 2016-03-31 ENCOUNTER — Encounter: Payer: Self-pay | Admitting: Family Medicine

## 2018-10-11 ENCOUNTER — Emergency Department (HOSPITAL_COMMUNITY): Payer: Medicare HMO

## 2018-10-11 ENCOUNTER — Emergency Department (HOSPITAL_COMMUNITY)
Admission: EM | Admit: 2018-10-11 | Discharge: 2018-10-11 | Disposition: A | Discharge status: Home / Self Care | Payer: Medicare HMO | Attending: Emergency Medicine | Admitting: Emergency Medicine

## 2018-10-11 ENCOUNTER — Other Ambulatory Visit: Payer: Self-pay

## 2018-10-11 DIAGNOSIS — R059 Cough, unspecified: Secondary | ICD-10-CM

## 2018-10-11 DIAGNOSIS — E119 Type 2 diabetes mellitus without complications: Secondary | ICD-10-CM | POA: Insufficient documentation

## 2018-10-11 DIAGNOSIS — I10 Essential (primary) hypertension: Secondary | ICD-10-CM | POA: Diagnosis not present

## 2018-10-11 DIAGNOSIS — J302 Other seasonal allergic rhinitis: Secondary | ICD-10-CM | POA: Diagnosis not present

## 2018-10-11 DIAGNOSIS — Z7984 Long term (current) use of oral hypoglycemic drugs: Secondary | ICD-10-CM | POA: Insufficient documentation

## 2018-10-11 DIAGNOSIS — R05 Cough: Secondary | ICD-10-CM | POA: Diagnosis present

## 2018-10-11 DIAGNOSIS — Z79899 Other long term (current) drug therapy: Secondary | ICD-10-CM | POA: Diagnosis not present

## 2018-10-11 LAB — COMPREHENSIVE METABOLIC PANEL
ALT: 16 U/L (ref 0–44)
AST: 19 U/L (ref 15–41)
Albumin: 3.3 g/dL — ABNORMAL LOW (ref 3.5–5.0)
Alkaline Phosphatase: 86 U/L (ref 38–126)
Anion gap: 9 (ref 5–15)
BUN: 19 mg/dL (ref 8–23)
CHLORIDE: 103 mmol/L (ref 98–111)
CO2: 24 mmol/L (ref 22–32)
Calcium: 9.3 mg/dL (ref 8.9–10.3)
Creatinine, Ser: 1.23 mg/dL (ref 0.61–1.24)
GFR calc Af Amer: >60 mL/min (ref >60)
GFR calc non Af Amer: 60 mL/min — ABNORMAL LOW (ref >60)
Glucose, Bld: 185 mg/dL — ABNORMAL HIGH (ref 70–99)
Potassium: 3.4 mmol/L — ABNORMAL LOW (ref 3.5–5.1)
SODIUM: 136 mmol/L (ref 135–145)
Total Bilirubin: 0.8 mg/dL (ref 0.3–1.2)
Total Protein: 7.9 g/dL (ref 6.5–8.1)

## 2018-10-11 LAB — CBC WITH DIFFERENTIAL/PLATELET
Abs Immature Granulocytes: 0.01 10*3/uL (ref 0.00–0.07)
Basophils Absolute: 0 10*3/uL (ref 0.0–0.1)
Basophils Relative: 0 %
EOS ABS: 0.2 10*3/uL (ref 0.0–0.5)
Eosinophils Relative: 2 %
HCT: 35.1 % — ABNORMAL LOW (ref 39.0–52.0)
Hemoglobin: 12.2 g/dL — ABNORMAL LOW (ref 13.0–17.0)
Immature Granulocytes: 0 %
Lymphocytes Relative: 12 %
Lymphs Abs: 0.8 10*3/uL (ref 0.7–4.0)
MCH: 26.6 pg (ref 26.0–34.0)
MCHC: 34.8 g/dL (ref 30.0–36.0)
MCV: 76.5 fL — ABNORMAL LOW (ref 80.0–100.0)
Monocytes Absolute: 1 10*3/uL (ref 0.1–1.0)
Monocytes Relative: 14 %
Neutro Abs: 5.1 10*3/uL (ref 1.7–7.7)
Neutrophils Relative %: 72 %
Platelets: 253 10*3/uL (ref 150–400)
RBC: 4.59 MIL/uL (ref 4.22–5.81)
RDW: 16.1 % — AB (ref 11.5–15.5)
WBC: 7.1 10*3/uL (ref 4.0–10.5)
nRBC: 0 % (ref 0.0–0.2)

## 2018-10-11 MED ORDER — FLUTICASONE PROPIONATE 50 MCG/ACT NA SUSP: 2.0000 | Freq: Every day | NASAL | 0 refills | Status: DC

## 2018-10-11 NOTE — ED Triage Notes (Signed)
Pt here evaluation of dry cough x 1 week, pt sts he feels normal. Daughter sts he is more fatigued than usual.

## 2018-10-11 NOTE — ED Notes (Signed)
Patient verbalizes understanding of discharge instructions. Opportunity for questioning and answers were provided. Armband removed by staff, pt discharged from ED.  

## 2018-10-11 NOTE — ED Provider Notes (Signed)
[facility] EMERGENCY DEPARTMENT Provider Note   CSN: [id number] Arrival date & time: 10/11/18  1638    History   Chief Complaint Chief Complaint  Patient presents with  . Cough    HPI Christopher Rivas is a 68 y.o. male.      Cough  Cough characteristics:  Dry and hacking Sputum characteristics:  Nondescript Severity:  Moderate Onset quality:  Gradual Duration:  2 weeks Timing:  Constant Progression:  Unchanged Chronicity:  New Smoker: no   Context comment:  Worse in morning and at night. Ineffective treatments:  Cough suppressants Associated symptoms: sinus congestion   Associated symptoms: no chest pain, no chills, no ear pain, no fever, no rash, no shortness of breath and no sore throat     Past Medical History:  Diagnosis Date  . Arthritis   . Diabetes mellitus   . Glaucoma   . Hypercholesterolemia   . Hypertension   . Prostate cancer     Patient Active Problem List   Diagnosis Date Noted  . Prostate cancer ([place]) 12/10/2014    Past Surgical History:  Procedure Laterality Date  . cataract surgery    . EYE SURGERY    . PROSTATE BIOPSY          Home Medications    Prior to Admission medications   Medication Sig Start Date End Date Taking? Authorizing Provider  ACCU-CHEK AVIVA PLUS test strip 2 (two) times daily. for testing 10/14/14   [provider]  Alogliptin-Pioglitazone (OSENI) 25-15 MG TABS Take 1 tablet by mouth daily.     [provider]  amLODipine (NORVASC) 10 MG tablet Take 10 mg by mouth daily.    [provider]  brimonidine-timolol (COMBIGAN) 0.2-0.5 % ophthalmic solution Place 1 drop into both eyes 2 (two) times daily.    [provider]  dorzolamide (TRUSOPT) 2 % ophthalmic solution Place 1 drop into both eyes 3 (three) times daily.    [provider]  fluorometholone (FML FORTE) 0.25 % ophthalmic suspension Apply to eye.    [provider]  fluticasone  (FLONASE) 50 MCG/ACT nasal spray Place 2 sprays into both nostrils daily for 10 days. 10/11/18 10/21/18  Tommie Raymond, MD  gabapentin (NEURONTIN) 300 MG capsule  10/30/14   [provider]  hydrochlorothiazide (HYDRODIURIL) 25 MG tablet Take 25 mg by mouth daily.    [provider]  LEVEMIR FLEXTOUCH 100 UNIT/ML Pen  09/30/14   [provider]  lisinopril (PRINIVIL,ZESTRIL) 10 MG tablet Take 10 mg by mouth daily.    [provider]  loteprednol (LOTEMAX) 0.5 % ophthalmic suspension Place 1 drop into both eyes 3 (three) times daily.    [provider]  lovastatin (MEVACOR) 20 MG tablet Take by mouth.    [provider]  metFORMIN (GLUCOPHAGE) 500 MG tablet Take 500 mg by mouth 2 (two) times daily with a meal.    [provider]  NOVOFINE 32G X 6 MM MISC  10/01/14   [provider]  pioglitazone (ACTOS) 15 MG tablet Take by mouth. 08/31/14   [provider]  prednisoLONE acetate (PRED FORTE) 1 % ophthalmic suspension Apply to eye. 04/23/14   [provider]  sitaGLIPtin-metformin (JANUMET) 50-1000 MG per tablet Take by mouth. 08/04/14   [provider]    Family History Family History  Problem Relation Age of Onset  . Cancer Brother        prostate  . Cancer Brother  prostate    Social History Social History   Tobacco Use  . Smoking status: Never Smoker  . Smokeless tobacco: Never Used  Substance Use Topics  . Alcohol use: No  . Drug use: No     Allergies   Patient has no known allergies.   Review of Systems Review of Systems  Constitutional: Negative for chills and fever.  HENT: Negative for ear pain and sore throat.   Eyes: Negative for pain and visual disturbance.  Respiratory: Positive for cough. Negative for shortness of breath.   Cardiovascular: Negative for chest pain and palpitations.  Gastrointestinal: Negative for abdominal pain and vomiting.  Genitourinary: Negative for  dysuria and hematuria.  Musculoskeletal: Negative for arthralgias and back pain.  Skin: Negative for color change and rash.  Neurological: Negative for seizures and syncope.  All other systems reviewed and are negative.    Physical Exam Updated Vital Signs BP 129/74   Pulse 92   Temp 98.4 F (36.9 C) (Oral)   Resp 16   Ht 5\' 8"  (1.727 m)   Wt 86.2 kg   SpO2 97%   BMI 28.89 kg/m   Physical Exam Vitals signs and nursing note reviewed.  Constitutional:      Appearance: He is well-developed.  HENT:     Head: Normocephalic and atraumatic.  Eyes:     Conjunctiva/sclera: Conjunctivae normal.  Neck:     Musculoskeletal: Neck supple.  Cardiovascular:     Rate and Rhythm: Normal rate and regular rhythm.     Heart sounds: No murmur.  Pulmonary:     Effort: Pulmonary effort is normal. No respiratory distress.     Breath sounds: Normal breath sounds. No wheezing, rhonchi or rales.  Chest:     Chest wall: No tenderness.  Abdominal:     Palpations: Abdomen is soft.     Tenderness: There is no abdominal tenderness.  Skin:    General: Skin is warm and dry.  Neurological:     General: No focal deficit present.     Mental Status: He is alert and oriented to person, place, and time.      ED Treatments / Results  Labs (all labs ordered are listed, but only abnormal results are displayed) Labs Reviewed  CBC WITH DIFFERENTIAL/PLATELET - Abnormal; Notable for the following components:      Result Value   Hemoglobin 12.2 (*)    HCT 35.1 (*)    MCV 76.5 (*)    RDW 16.1 (*)    All other components within normal limits  COMPREHENSIVE METABOLIC PANEL - Abnormal; Notable for the following components:   Potassium 3.4 (*)    Glucose, Bld 185 (*)    Albumin 3.3 (*)    GFR calc non Af Amer 60 (*)    All other components within normal limits    EKG None  Radiology Dg Chest 2 View  Result Date: 10/11/2018 CLINICAL DATA:  Cough EXAM: CHEST - 2 VIEW COMPARISON:  None. FINDINGS:  The heart size and mediastinal contours are within normal limits. Both lungs are clear. The visualized skeletal structures are unremarkable. IMPRESSION: No active cardiopulmonary disease. Electronically Signed   By: Ulyses Jarred M.D.   On: 10/11/2018 17:44    Procedures Procedures (including critical care time)  Medications Ordered in ED Medications - No data to display   Initial Impression / Assessment and Plan / ED Course  I have reviewed the triage vital signs and the nursing notes.  Pertinent labs &  imaging results that were available during my care of the patient were reviewed by me and considered in my medical decision making (see chart for details).        Patient is a 68 year old male who presents the emergency department complaining of persistent cough now for the past 2 weeks.  Patient denies any fevers, body aches, chills, chest pain, or shortness of breath associated with this cough.  He states that it started shortly after returning from ophthalmology appointment 2 weeks ago.  Patient's granddaughter has attempted giving him over-the-counter antitussives with no real benefit noted.  On initial evaluation the patient he was hemodynamically stable and nontoxic-appearing.  Patient was afebrile, not tachycardic, normotensive, satting appropriately on room air.  Physical exam as detailed above which is remarkable for elderly gentleman resting comfortably in bed.  Patient shows no evidence of respiratory distress, increased work of breathing, or coarse breath sounds on auscultation of the lungs.  Chest x-ray shows no acute cardiopulmonary pathology.  In specific, there is no focal consolidation to suggest pneumonia.  Patient's CBC unremarkable with no leukocytosis.  Metabolic panel with no significant metabolic or electrolyte derangements.  Although patient's current medical record states that he is on lisinopril, I have checked with the family and he has no longer on an ACE  inhibitor.  Doubt this is contributing to his persistent cough.  Patient also has no wheezing on auscultations of the lung leaving cough variant asthma is less likely.  His description of symptoms are consistent with allergy induced postnasal drip leading to dry hacking cough.  This is likely given the patient's symptoms of congestion and worsening cough in the morning and at night.  I discussed this with the patient's family he was reassured.  He was given prescription for Flonase at the time of discharge.  I encouraged him to follow-up with his primary care physician for further work-up if cough continues to be persistent.  Patient may need over-the-counter H2 blocker or PPI if Flonase does not improve his symptoms for possible reflux induced cough.  I also discussed this with the patient and his granddaughter who is at bedside.  I went over concerning signs and symptoms that would necessitate return to the emergency department.  Patient and granddaughter both voiced understanding of these instructions and had no further questions at this time.    Final Clinical Impressions(s) / ED Diagnoses   Final diagnoses:  Cough  Seasonal allergies    ED Discharge Orders         Ordered    fluticasone (FLONASE) 50 MCG/ACT nasal spray  Daily     10/11/18 1814           Tommie Raymond, MD 10/11/18 [place], Wenda Overland, MD 10/14/18 1215

## 2019-08-20 ENCOUNTER — Ambulatory Visit: Payer: Medicare HMO | Attending: Internal Medicine

## 2019-09-03 ENCOUNTER — Ambulatory Visit: Payer: Medicare HMO | Attending: Internal Medicine

## 2019-09-03 DIAGNOSIS — Z20822 Contact with and (suspected) exposure to covid-19: Secondary | ICD-10-CM

## 2019-09-04 LAB — NOVEL CORONAVIRUS, NAA: SARS-CoV-2, NAA: NOT DETECTED

## 2019-10-10 ENCOUNTER — Ambulatory Visit: Payer: Medicare HMO | Attending: Family

## 2019-10-10 DIAGNOSIS — Z23 Encounter for immunization: Secondary | ICD-10-CM

## 2019-10-10 NOTE — Progress Notes (Signed)
   Covid-19 Vaccination Clinic  Name:  NILTON FISCHETTI    MRN: [id number] DOB: 08/29/1950  10/10/2019  Mr. Zoto was observed post Covid-19 immunization for 15 minutes without incident. He was provided with Vaccine Information Sheet and instruction to access the V-Safe system.   Mr. Gewirtz was instructed to call 911 with any severe reactions post vaccine: Marland Kitchen Difficulty breathing  . Swelling of face and throat  . A fast heartbeat  . A bad rash all over body  . Dizziness and weakness   Immunizations Administered    Name Date Dose VIS Date Route   Moderna COVID-19 Vaccine 10/10/2019 10:57 AM 0.5 mL 06/25/2019 Intramuscular   Manufacturer: Moderna   Lot: [id number]   [place]:[id number]

## 2019-11-12 ENCOUNTER — Ambulatory Visit: Payer: Medicare HMO | Attending: Family

## 2019-11-12 DIAGNOSIS — Z23 Encounter for immunization: Secondary | ICD-10-CM

## 2019-11-12 NOTE — Progress Notes (Signed)
   Covid-19 Vaccination Clinic  Name:  Christopher Rivas    MRN: [id number] DOB: 1951-02-22  11/12/2019  Mr. Thalman was observed post Covid-19 immunization for 15 minutes without incident. He was provided with Vaccine Information Sheet and instruction to access the V-Safe system.   Mr. Fahrenkrug was instructed to call 911 with any severe reactions post vaccine: Marland Kitchen Difficulty breathing  . Swelling of face and throat  . A fast heartbeat  . A bad rash all over body  . Dizziness and weakness   Immunizations Administered    Name Date Dose VIS Date Route   Moderna COVID-19 Vaccine 11/12/2019 10:15 AM 0.5 mL 06/2019 Intramuscular   Manufacturer: Moderna   Lot: [id number]   [id number]

## 2020-01-03 ENCOUNTER — Encounter: Payer: Self-pay | Admitting: Podiatry

## 2020-01-03 ENCOUNTER — Ambulatory Visit (INDEPENDENT_AMBULATORY_CARE_PROVIDER_SITE_OTHER): Payer: Medicare HMO | Admitting: Podiatry

## 2020-01-03 ENCOUNTER — Other Ambulatory Visit: Payer: Self-pay

## 2020-01-03 DIAGNOSIS — M79675 Pain in left toe(s): Secondary | ICD-10-CM

## 2020-01-03 DIAGNOSIS — E11311 Type 2 diabetes mellitus with unspecified diabetic retinopathy with macular edema: Secondary | ICD-10-CM

## 2020-01-03 DIAGNOSIS — M79674 Pain in right toe(s): Secondary | ICD-10-CM | POA: Insufficient documentation

## 2020-01-03 DIAGNOSIS — B351 Tinea unguium: Secondary | ICD-10-CM | POA: Diagnosis not present

## 2020-01-03 NOTE — Progress Notes (Signed)
This patient returns to my office for at risk foot care.  This patient requires this care by a professional since this patient will be at risk due to having diabetes.    This patient is unable to cut nails himself since the patient cannot reach his nails.These nails are painful walking and wearing shoes. This patient presents to the office with his wife. This patient presents for at risk foot care today.  General Appearance  Alert, conversant and in no acute stress.  Vascular  Dorsalis pedis and posterior tibial  pulses are not  palpable  bilaterally.  Capillary return is within normal limits  bilaterally. Temperature is within normal limits  bilaterally.  Neurologic  Senn-Weinstein monofilament wire test diminished   bilaterally. Muscle power within normal limits bilaterally.  Nails Thick disfigured discolored nails with subungual debris  from hallux to fifth toes bilaterally. No evidence of bacterial infection or drainage bilaterally.  Orthopedic  No limitations of motion  feet .  No crepitus or effusions noted.  No bony pathology or digital deformities noted.  Skin  normotropic skin with no porokeratosis noted bilaterally.  No signs of infections or ulcers noted.     Onychomycosis  Pain in right toes  Pain in left toes  Consent was obtained for treatment procedures.   Mechanical debridement of nails 1-5  bilaterally performed with a nail nipper.  Filed with dremel without incident.    Return office visit    3 months                 Told patient to return for periodic foot care and evaluation due to potential at risk complications.   Gardiner Barefoot DPM

## 2020-04-10 ENCOUNTER — Encounter: Payer: Self-pay | Admitting: Podiatry

## 2020-04-10 ENCOUNTER — Other Ambulatory Visit: Payer: Self-pay

## 2020-04-10 ENCOUNTER — Ambulatory Visit (INDEPENDENT_AMBULATORY_CARE_PROVIDER_SITE_OTHER): Payer: Medicare HMO | Admitting: Podiatry

## 2020-04-10 DIAGNOSIS — B351 Tinea unguium: Secondary | ICD-10-CM

## 2020-04-10 DIAGNOSIS — M79674 Pain in right toe(s): Secondary | ICD-10-CM

## 2020-04-10 DIAGNOSIS — E1149 Type 2 diabetes mellitus with other diabetic neurological complication: Secondary | ICD-10-CM | POA: Diagnosis not present

## 2020-04-10 DIAGNOSIS — M79675 Pain in left toe(s): Secondary | ICD-10-CM | POA: Diagnosis not present

## 2020-04-10 NOTE — Progress Notes (Signed)
This patient returns to my office for at risk foot care.  This patient requires this care by a professional since this patient will be at risk due to having diabetes.    This patient is unable to cut nails himself since the patient cannot reach his nails.These nails are painful walking and wearing shoes. This patient presents to the office with his wife. This patient presents for at risk foot care today.  General Appearance  Alert, conversant and in no acute stress.  Vascular  Dorsalis pedis and posterior tibial  pulses are not  palpable  bilaterally.  Capillary return is within normal limits  bilaterally. Temperature is within normal limits  bilaterally.  Neurologic  Senn-Weinstein monofilament wire test diminished   bilaterally. Muscle power within normal limits bilaterally.  Nails Thick disfigured discolored nails with subungual debris  from hallux to fifth toes bilaterally. No evidence of bacterial infection or drainage bilaterally.  Orthopedic  No limitations of motion  feet .  No crepitus or effusions noted.  No bony pathology or digital deformities noted.  Skin  normotropic skin with no porokeratosis noted bilaterally.  No signs of infections or ulcers noted.     Onychomycosis  Pain in right toes  Pain in left toes  Consent was obtained for treatment procedures.   Mechanical debridement of nails 1-5  bilaterally performed with a nail nipper.  Filed with dremel without incident.    Return office visit    3 months                 Told patient to return for periodic foot care and evaluation due to potential at risk complications.   Gardiner Barefoot DPM

## 2020-05-28 ENCOUNTER — Ambulatory Visit: Payer: Medicare HMO | Attending: Internal Medicine

## 2020-05-28 DIAGNOSIS — Z23 Encounter for immunization: Secondary | ICD-10-CM

## 2020-05-28 NOTE — Progress Notes (Signed)
   Covid-19 Vaccination Clinic  Name:  BASTION BOLGER    MRN: [id number] DOB: 12/16/1950  05/28/2020  Mr. Pettet was observed post Covid-19 immunization for 15 minutes without incident. He was provided with Vaccine Information Sheet and instruction to access the V-Safe system.   Mr. Biskup was instructed to call 911 with any severe reactions post vaccine: Marland Kitchen Difficulty breathing  . Swelling of face and throat  . A fast heartbeat  . A bad rash all over body  . Dizziness and weakness

## 2020-07-10 ENCOUNTER — Other Ambulatory Visit: Payer: Self-pay

## 2020-07-10 ENCOUNTER — Encounter: Payer: Self-pay | Admitting: Podiatry

## 2020-07-10 ENCOUNTER — Ambulatory Visit (INDEPENDENT_AMBULATORY_CARE_PROVIDER_SITE_OTHER): Payer: Medicare HMO | Admitting: Podiatry

## 2020-07-10 DIAGNOSIS — B351 Tinea unguium: Secondary | ICD-10-CM | POA: Diagnosis not present

## 2020-07-10 DIAGNOSIS — M79674 Pain in right toe(s): Secondary | ICD-10-CM

## 2020-07-10 DIAGNOSIS — E1149 Type 2 diabetes mellitus with other diabetic neurological complication: Secondary | ICD-10-CM

## 2020-07-10 DIAGNOSIS — M79675 Pain in left toe(s): Secondary | ICD-10-CM | POA: Diagnosis not present

## 2020-07-10 NOTE — Progress Notes (Signed)
This patient returns to my office for at risk foot care.  This patient requires this care by a professional since this patient will be at risk due to having diabetes.    This patient is unable to cut nails himself since the patient cannot reach his nails.These nails are painful walking and wearing shoes. This patient presents to the office with his wife. This patient presents for at risk foot care today.  General Appearance  Alert, conversant and in no acute stress.  Vascular  Dorsalis pedis and posterior tibial  pulses are not  palpable  bilaterally.  Capillary return is within normal limits  bilaterally. Temperature is within normal limits  bilaterally.  Neurologic  Senn-Weinstein monofilament wire test diminished   bilaterally. Muscle power within normal limits bilaterally.  Nails Thick disfigured discolored nails with subungual debris  from hallux to fifth toes bilaterally. No evidence of bacterial infection or drainage bilaterally.  Orthopedic  No limitations of motion  feet .  No crepitus or effusions noted.  No bony pathology or digital deformities noted.  Skin  normotropic skin with no porokeratosis noted bilaterally.  No signs of infections or ulcers noted.     Onychomycosis  Pain in right toes  Pain in left toes  Consent was obtained for treatment procedures.   Mechanical debridement of nails 1-5  bilaterally performed with a nail nipper.  Filed with dremel without incident.    Return office visit    3 months                 Told patient to return for periodic foot care and evaluation due to potential at risk complications.   Gardiner Barefoot DPM

## 2020-10-21 ENCOUNTER — Ambulatory Visit (INDEPENDENT_AMBULATORY_CARE_PROVIDER_SITE_OTHER): Payer: Medicare HMO | Admitting: Podiatry

## 2020-10-21 ENCOUNTER — Encounter: Payer: Self-pay | Admitting: Podiatry

## 2020-10-21 ENCOUNTER — Other Ambulatory Visit: Payer: Self-pay

## 2020-10-21 DIAGNOSIS — B351 Tinea unguium: Secondary | ICD-10-CM | POA: Diagnosis not present

## 2020-10-21 DIAGNOSIS — M79674 Pain in right toe(s): Secondary | ICD-10-CM | POA: Diagnosis not present

## 2020-10-21 DIAGNOSIS — M79675 Pain in left toe(s): Secondary | ICD-10-CM | POA: Diagnosis not present

## 2020-10-21 DIAGNOSIS — E1149 Type 2 diabetes mellitus with other diabetic neurological complication: Secondary | ICD-10-CM

## 2020-10-21 NOTE — Progress Notes (Signed)
This patient returns to my office for at risk foot care.  This patient requires this care by a professional since this patient will be at risk due to having diabetes.    This patient is unable to cut nails himself since the patient cannot reach his nails.These nails are painful walking and wearing shoes. This patient presents to the office with his wife. This patient presents for at risk foot care today.  General Appearance  Alert, conversant and in no acute stress.  Vascular  Dorsalis pedis and posterior tibial  pulses are not  palpable  bilaterally.  Capillary return is within normal limits  bilaterally. Temperature is within normal limits  bilaterally.  Neurologic  Senn-Weinstein monofilament wire test diminished   bilaterally. Muscle power within normal limits bilaterally.  Nails Thick disfigured discolored nails with subungual debris  from hallux to fifth toes bilaterally. No evidence of bacterial infection or drainage bilaterally.  Orthopedic  No limitations of motion  feet .  No crepitus or effusions noted.  No bony pathology or digital deformities noted.  Skin  normotropic skin with no porokeratosis noted bilaterally.  No signs of infections or ulcers noted.     Onychomycosis  Pain in right toes  Pain in left toes  Consent was obtained for treatment procedures.   Mechanical debridement of nails 1-5  bilaterally performed with a nail nipper.  Filed with dremel without incident.    Return office visit    3 months                 Told patient to return for periodic foot care and evaluation due to potential at risk complications.   Gardiner Barefoot DPM

## 2021-01-22 ENCOUNTER — Other Ambulatory Visit: Payer: Self-pay

## 2021-01-22 ENCOUNTER — Ambulatory Visit (INDEPENDENT_AMBULATORY_CARE_PROVIDER_SITE_OTHER): Payer: Medicare HMO | Admitting: Podiatry

## 2021-01-22 ENCOUNTER — Encounter: Payer: Self-pay | Admitting: Podiatry

## 2021-01-22 DIAGNOSIS — M79675 Pain in left toe(s): Secondary | ICD-10-CM | POA: Diagnosis not present

## 2021-01-22 DIAGNOSIS — M79674 Pain in right toe(s): Secondary | ICD-10-CM

## 2021-01-22 DIAGNOSIS — E1149 Type 2 diabetes mellitus with other diabetic neurological complication: Secondary | ICD-10-CM

## 2021-01-22 DIAGNOSIS — B351 Tinea unguium: Secondary | ICD-10-CM | POA: Diagnosis not present

## 2021-01-22 NOTE — Progress Notes (Signed)
This patient returns to my office for at risk foot care.  This patient requires this care by a professional since this patient will be at risk due to having diabetes.    This patient is unable to cut nails himself since the patient cannot reach his nails.These nails are painful walking and wearing shoes. This patient presents to the office with his wife. This patient presents for at risk foot care today.  General Appearance  Alert, conversant and in no acute stress.  Vascular  Dorsalis pedis and posterior tibial  pulses are not  palpable  bilaterally.  Capillary return is within normal limits  bilaterally. Temperature is within normal limits  bilaterally.  Neurologic  Senn-Weinstein monofilament wire test diminished   bilaterally. Muscle power within normal limits bilaterally.  Nails Thick disfigured discolored nails with subungual debris  from hallux to fifth toes bilaterally. No evidence of bacterial infection or drainage bilaterally.  Orthopedic  No limitations of motion  feet .  No crepitus or effusions noted.  No bony pathology or digital deformities noted.  Skin  normotropic skin with no porokeratosis noted bilaterally.  No signs of infections or ulcers noted.   Open wound left ankle dorsally  x 2 yeats.  Onychomycosis  Pain in right toes  Pain in left toes  Consent was obtained for treatment procedures.   Mechanical debridement of nails 1-5  bilaterally performed with a nail nipper.  Filed with dremel without incident. Applying medicine which was previously prescribed.   Return office visit    3 months                 Told patient to return for periodic foot care and evaluation due to potential at risk complications.   Gardiner Barefoot DPM

## 2021-04-28 ENCOUNTER — Other Ambulatory Visit: Payer: Self-pay

## 2021-04-28 ENCOUNTER — Ambulatory Visit (INDEPENDENT_AMBULATORY_CARE_PROVIDER_SITE_OTHER): Payer: Medicare HMO | Admitting: Podiatry

## 2021-04-28 ENCOUNTER — Encounter: Payer: Self-pay | Admitting: Podiatry

## 2021-04-28 DIAGNOSIS — B351 Tinea unguium: Secondary | ICD-10-CM

## 2021-04-28 DIAGNOSIS — E1149 Type 2 diabetes mellitus with other diabetic neurological complication: Secondary | ICD-10-CM | POA: Diagnosis not present

## 2021-04-28 DIAGNOSIS — M79675 Pain in left toe(s): Secondary | ICD-10-CM | POA: Diagnosis not present

## 2021-04-28 DIAGNOSIS — M79674 Pain in right toe(s): Secondary | ICD-10-CM

## 2021-04-28 DIAGNOSIS — E11311 Type 2 diabetes mellitus with unspecified diabetic retinopathy with macular edema: Secondary | ICD-10-CM

## 2021-04-28 NOTE — Progress Notes (Signed)
This patient returns to my office for at risk foot care.  This patient requires this care by a professional since this patient will be at risk due to having diabetes.    This patient is unable to cut nails himself since the patient cannot reach his nails.These nails are painful walking and wearing shoes. This patient presents to the office with his wife. This patient presents for at risk foot care today.  General Appearance  Alert, conversant and in no acute stress.  Vascular  Dorsalis pedis and posterior tibial  pulses are not  palpable  bilaterally.  Capillary return is within normal limits  bilaterally. Temperature is within normal limits  bilaterally.  Neurologic  Senn-Weinstein monofilament wire test diminished   bilaterally. Muscle power within normal limits bilaterally.  Nails Thick disfigured discolored nails with subungual debris  from hallux to fifth toes bilaterally. No evidence of bacterial infection or drainage bilaterally.  Orthopedic  No limitations of motion  feet .  No crepitus or effusions noted.  No bony pathology or digital deformities noted.  Skin  normotropic skin with no porokeratosis noted bilaterally.  No signs of infections or ulcers noted.     Onychomycosis  Pain in right toes  Pain in left toes  Consent was obtained for treatment procedures.   Mechanical debridement of nails 1-5  bilaterally performed with a nail nipper.  Filed with dremel without incident.    Return office visit    3 months                 Told patient to return for periodic foot care and evaluation due to potential at risk complications.   Gardiner Barefoot DPM

## 2021-06-26 ENCOUNTER — Emergency Department (HOSPITAL_COMMUNITY): Payer: Medicare HMO

## 2021-06-26 ENCOUNTER — Emergency Department (HOSPITAL_COMMUNITY)
Admission: EM | Admit: 2021-06-26 | Discharge: 2021-06-26 | Disposition: A | Discharge status: Home / Self Care | Payer: Medicare HMO | Attending: Emergency Medicine | Admitting: Emergency Medicine

## 2021-06-26 ENCOUNTER — Encounter (HOSPITAL_COMMUNITY): Payer: Self-pay | Admitting: Emergency Medicine

## 2021-06-26 DIAGNOSIS — Z8546 Personal history of malignant neoplasm of prostate: Secondary | ICD-10-CM | POA: Insufficient documentation

## 2021-06-26 DIAGNOSIS — Z7984 Long term (current) use of oral hypoglycemic drugs: Secondary | ICD-10-CM | POA: Insufficient documentation

## 2021-06-26 DIAGNOSIS — W010XXA Fall on same level from slipping, tripping and stumbling without subsequent striking against object, initial encounter: Secondary | ICD-10-CM | POA: Insufficient documentation

## 2021-06-26 DIAGNOSIS — E1165 Type 2 diabetes mellitus with hyperglycemia: Secondary | ICD-10-CM | POA: Diagnosis not present

## 2021-06-26 DIAGNOSIS — Z79899 Other long term (current) drug therapy: Secondary | ICD-10-CM | POA: Insufficient documentation

## 2021-06-26 DIAGNOSIS — R55 Syncope and collapse: Secondary | ICD-10-CM | POA: Diagnosis not present

## 2021-06-26 DIAGNOSIS — Y92000 Kitchen of unspecified non-institutional (private) residence as  the place of occurrence of the external cause: Secondary | ICD-10-CM | POA: Diagnosis not present

## 2021-06-26 DIAGNOSIS — I10 Essential (primary) hypertension: Secondary | ICD-10-CM | POA: Insufficient documentation

## 2021-06-26 DIAGNOSIS — R42 Dizziness and giddiness: Secondary | ICD-10-CM | POA: Insufficient documentation

## 2021-06-26 DIAGNOSIS — Y9301 Activity, walking, marching and hiking: Secondary | ICD-10-CM | POA: Diagnosis not present

## 2021-06-26 DIAGNOSIS — E113513 Type 2 diabetes mellitus with proliferative diabetic retinopathy with macular edema, bilateral: Secondary | ICD-10-CM | POA: Insufficient documentation

## 2021-06-26 LAB — CBC WITH DIFFERENTIAL/PLATELET
Abs Immature Granulocytes: 0.01 10*3/uL (ref 0.00–0.07)
Basophils Absolute: 0 10*3/uL (ref 0.0–0.1)
Basophils Relative: 0 %
Eosinophils Absolute: 0.1 10*3/uL (ref 0.0–0.5)
Eosinophils Relative: 2 %
HCT: 38.3 % — ABNORMAL LOW (ref 39.0–52.0)
Hemoglobin: 13.1 g/dL (ref 13.0–17.0)
Immature Granulocytes: 0 %
Lymphocytes Relative: 28 %
Lymphs Abs: 1.4 10*3/uL (ref 0.7–4.0)
MCH: 25.9 pg — ABNORMAL LOW (ref 26.0–34.0)
MCHC: 34.2 g/dL (ref 30.0–36.0)
MCV: 75.7 fL — ABNORMAL LOW (ref 80.0–100.0)
Monocytes Absolute: 0.5 10*3/uL (ref 0.1–1.0)
Monocytes Relative: 9 %
Neutro Abs: 3.2 10*3/uL (ref 1.7–7.7)
Neutrophils Relative %: 61 %
Platelets: 189 10*3/uL (ref 150–400)
RBC: 5.06 MIL/uL (ref 4.22–5.81)
RDW: 16.5 % — ABNORMAL HIGH (ref 11.5–15.5)
WBC: 5.2 10*3/uL (ref 4.0–10.5)
nRBC: 0 % (ref 0.0–0.2)

## 2021-06-26 LAB — COMPREHENSIVE METABOLIC PANEL
ALT: 14 U/L (ref 0–44)
AST: 18 U/L (ref 15–41)
Albumin: 3.4 g/dL — ABNORMAL LOW (ref 3.5–5.0)
Alkaline Phosphatase: 91 U/L (ref 38–126)
Anion gap: 9 (ref 5–15)
BUN: 15 mg/dL (ref 8–23)
CO2: 22 mmol/L (ref 22–32)
Calcium: 9 mg/dL (ref 8.9–10.3)
Chloride: 106 mmol/L (ref 98–111)
Creatinine, Ser: 1.3 mg/dL — ABNORMAL HIGH (ref 0.61–1.24)
GFR, Estimated: 59 mL/min — ABNORMAL LOW (ref >60)
Glucose, Bld: 285 mg/dL — ABNORMAL HIGH (ref 70–99)
Potassium: 3.9 mmol/L (ref 3.5–5.1)
Sodium: 137 mmol/L (ref 135–145)
Total Bilirubin: 0.7 mg/dL (ref 0.3–1.2)
Total Protein: 6.7 g/dL (ref 6.5–8.1)

## 2021-06-26 LAB — CBG MONITORING, ED: Glucose-Capillary: 278 mg/dL — ABNORMAL HIGH (ref 70–99)

## 2021-06-26 MED ORDER — SODIUM CHLORIDE 0.9 % IV SOLN: INTRAVENOUS | Status: DC

## 2021-06-26 MED ORDER — SODIUM CHLORIDE 0.9 % IV BOLUS
1000.0000 mL | Freq: Once | INTRAVENOUS | Status: AC
  Administered 2021-06-26: 1000 mL via INTRAVENOUS

## 2021-06-26 NOTE — ED Notes (Signed)
Patient transported to CT 

## 2021-06-26 NOTE — Discharge Instructions (Signed)
As discussed, your evaluation today has been largely reassuring.  But, it is important that you monitor your condition carefully, and do not hesitate to return to the ED if you develop new, or concerning changes in your condition.  Otherwise, please follow-up with your physician for appropriate ongoing care.  If you are unable to follow-up with your physician, please call our heart and vascular Center for follow-up.  In particular, discussed today's episode of losing consciousness, and consider additional studies, possibly including Holter monitor which is a continuous cardiac monitoring device.  In the interim, please be sure to drink plenty fluids, stay well-hydrated.

## 2021-06-26 NOTE — ED Triage Notes (Signed)
Pt BIB [facility] from home. Pt was standing in the kitchen at home when he had a syncopal episode with fall. Denies injury or pain from fall. Wife reports pt was unconscious for 10 minutes. Ambulatory to RR after. Pt was incontinent of bowel during episode. Pt reports dizziness prior to episode. Denies CP/SOB/HA. 12 lead unremarkable.  EMS VS-  Initial BP 90 palp, then 122/72, HR 80, 97% RA, CBG 319 (pt is a diabetic)

## 2021-06-26 NOTE — ED Provider Notes (Signed)
[facility] EMERGENCY DEPARTMENT Provider Note   CSN: [id number] Arrival date & time: 06/26/21  1323     History Chief Complaint  Patient presents with   Loss of Consciousness    BOLTON CANUPP is a 70 y.o. male.  HPI Presents with loss of consciousness.  He notes that he was walking from his kitchen when he felt lightheaded and lost consciousness.  He may have been unconscious for about 10 minutes, according to family members.  He does recall no chest pain before, nor any afterwards.  Currently he has no complaints.  He states that he has been doing generally well, no recent medication change, diet change, activity change. Per EMS the patient had hypotension initially, which improved.  He was also hyperglycemic, with a glucose of greater than 300.    Past Medical History:  Diagnosis Date   Arthritis    Diabetes mellitus    Glaucoma    Hypercholesterolemia    Hypertension    Prostate cancer [facility])     Patient Active Problem List   Diagnosis Date Noted   Pain due to onychomycosis of toenails of both feet 01/03/2020   Prostate cancer ([place]) 12/10/2014   Pseudophakia of both eyes 02/05/2014   DM type 2 (diabetes mellitus, type 2) ([place]) 09/05/2013   HTN (hypertension) 09/05/2013   Hyperlipidemia 09/05/2013   Other states following surgery of eye and adnexa 10/05/2012   OAG (open angle glaucoma) 08/29/2012   Diabetic macular edema of both eyes ([place]) 04/06/2012   PDR (proliferative diabetic retinopathy) ([place]) 04/06/2012    Past Surgical History:  Procedure Laterality Date   cataract surgery     EYE SURGERY     PROSTATE BIOPSY         Family History  Problem Relation Age of Onset   Cancer Brother        prostate   Cancer Brother        prostate    Social History   Tobacco Use   Smoking status: Never   Smokeless tobacco: Never  Substance Use Topics   Alcohol use: No   Drug use: No    Home Medications Prior to Admission medications    Medication Sig Start Date End Date Taking? Authorizing Provider  ACCU-CHEK AVIVA PLUS test strip 2 (two) times daily. for testing 10/14/14   [provider]  Alogliptin-Pioglitazone (OSENI) 25-15 MG TABS Take 1 tablet by mouth daily.     [provider]  amLODipine (NORVASC) 10 MG tablet Take by mouth.    [provider]  brimonidine-timolol (COMBIGAN) 0.2-0.5 % ophthalmic solution Apply to eye. 06/27/19   [provider]  dorzolamide (TRUSOPT) 2 % ophthalmic solution Apply to eye. 06/27/19   [provider]  dorzolamide (TRUSOPT) 2 % ophthalmic solution Apply to eye. 11/11/20   [provider]  fluorometholone (FML FORTE) 0.25 % ophthalmic suspension Apply to eye.    [provider]  fluticasone (FLONASE) 50 MCG/ACT nasal spray Place 2 sprays into both nostrils daily for 10 days. 10/11/18 10/21/18  Tommie Raymond, MD  gabapentin (NEURONTIN) 300 MG capsule Take by mouth.    [provider]  glimepiride (AMARYL) 2 MG tablet Take by mouth.    [provider]  hydrochlorothiazide (HYDRODIURIL) 25 MG tablet Take by mouth.    [provider]  latanoprost (XALATAN) 0.005 % ophthalmic solution Apply to eye. 11/18/19   [provider]  latanoprost (XALATAN) 0.005 % ophthalmic solution Apply to eye.  08/25/20   [provider]  LEVEMIR FLEXTOUCH 100 UNIT/ML Pen  09/30/14   [provider]  lisinopril (ZESTRIL) 10 MG tablet Take by mouth. 07/10/13   [provider]  loteprednol (LOTEMAX) 0.5 % ophthalmic suspension Place 1 drop into both eyes 3 (three) times daily.    [provider]  lovastatin (MEVACOR) 20 MG tablet Take by mouth.    [provider]  metFORMIN (GLUCOPHAGE) 500 MG tablet Take 500 mg by mouth 2 (two) times daily with a meal.    [provider]  NOVOFINE 32G X 6 MM MISC  10/01/14   [provider]  pioglitazone (ACTOS) 45 MG tablet  11/21/19    [provider]  prednisoLONE acetate (PRED FORTE) 1 % ophthalmic suspension Apply to eye. 04/23/14   [provider]  sitaGLIPtin-metformin (JANUMET) 50-1000 MG tablet Take 1 tablet by mouth every morning. 08/04/14   [provider]  triamcinolone cream (KENALOG) 0.1 %  11/25/19   [provider]    Allergies    Patient has no known allergies.  Review of Systems   Review of Systems  Constitutional:        Per HPI, otherwise negative  HENT:         Per HPI, otherwise negative  Respiratory:         Per HPI, otherwise negative  Cardiovascular:        Per HPI, otherwise negative  Gastrointestinal:  Negative for vomiting.  Endocrine:       Negative aside from HPI  Genitourinary:        Neg aside from HPI   Musculoskeletal:        Per HPI, otherwise negative  Skin: Negative.   Neurological:  Positive for syncope.   Physical Exam Updated Vital Signs BP (!) 123/55   Pulse 74   Temp 97.9 F (36.6 C) (Oral)   Resp 14   Ht 5\' 8"  (1.727 m)   Wt 83.5 kg   SpO2 100%   BMI 27.98 kg/m   Physical Exam Vitals and nursing note reviewed.  Constitutional:      General: He is not in acute distress.    Appearance: He is well-developed.  HENT:     Head: Normocephalic and atraumatic.  Eyes:     Conjunctiva/sclera: Conjunctivae normal.  Cardiovascular:     Rate and Rhythm: Normal rate and regular rhythm.  Pulmonary:     Effort: Pulmonary effort is normal. No respiratory distress.     Breath sounds: No stridor.  Abdominal:     General: There is no distension.  Skin:    General: Skin is warm and dry.  Neurological:     Mental Status: He is alert and oriented to person, place, and time.    ED Results / Procedures / Treatments   Labs (all labs ordered are listed, but only abnormal results are displayed) Labs Reviewed  COMPREHENSIVE METABOLIC PANEL - Abnormal; Notable for the following components:      Result Value   Glucose, Bld 285 (*)     Creatinine, Ser 1.30 (*)    Albumin 3.4 (*)    GFR, Estimated 59 (*)    All other components within normal limits  CBC WITH DIFFERENTIAL/PLATELET - Abnormal; Notable for the following components:   HCT 38.3 (*)    MCV 75.7 (*)    MCH 25.9 (*)    RDW 16.5 (*)    All other components within normal limits  CBG  MONITORING, ED - Abnormal; Notable for the following components:   Glucose-Capillary 278 (*)    All other components within normal limits    EKG EKG Interpretation  Date/Time:  Saturday June 26 2021 13:25:55 EST Ventricular Rate:  80 PR Interval:  119 QRS Duration: 84 QT Interval:  378 QTC Calculation: 436 R Axis:   26 Text Interpretation: Sinus rhythm Borderline short PR interval Borderline repolarization abnormality ST-t wave abnormality Abnormal ECG Confirmed by Carmin Muskrat [phone]) on 06/26/2021 1:41:52 PM  Radiology CT Head Wo Contrast  Result Date: 06/26/2021 CLINICAL DATA:  A 70 year old male presents for evaluation of head trauma. EXAM: CT HEAD WITHOUT CONTRAST TECHNIQUE: Contiguous axial images were obtained from the base of the skull through the vertex without intravenous contrast. COMPARISON:  None FINDINGS: Brain: Extensive white matter disease and scattered areas of prior infarct. No priors for comparison. No acute intracranial hemorrhage or midline shift. Signs of prior RIGHT cerebellar infarct in addition to multifocal areas of presumed lacunar infarction in bilateral deep white matter and basal ganglia. Vascular: No hyperdense vessel or unexpected calcification. Skull: Normal. Negative for fracture or focal lesion. Sinuses/Orbits: Visualized paranasal sinuses and orbits without acute process. Postprocedural changes in the orbits may relate to scleral buckling or similar procedure. Correlate with surgical history. Other: None IMPRESSION: No acute intracranial hemorrhage Extensive white matter disease and scattered areas of prior infarct. In the absence of priors  some of these areas are age indeterminate. Some are clearly water density and chronic. If there is high clinical suspicion for infarct MRI may be helpful for further evaluation as warranted. Postprocedural changes in the orbits may relate to scleral buckling or similar procedure. Correlate with surgical history. Electronically Signed   By: Zetta Bills M.D.   On: 06/26/2021 18:50   DG Chest Port 1 View  Result Date: 06/26/2021 CLINICAL DATA:  Loss of consciousness EXAM: PORTABLE CHEST 1 VIEW COMPARISON:  10/11/2018 FINDINGS: The heart size and mediastinal contours are within normal limits. Unchanged elevation of the left hemidiaphragm. No acute airspace opacity. The visualized skeletal structures are unremarkable. IMPRESSION: No acute abnormality of the lungs in AP portable projection. Unchanged elevation of the left hemidiaphragm. Electronically Signed   By: Delanna Ahmadi M.D.   On: 06/26/2021 14:29    Procedures Procedures   Medications Ordered in ED Medications  0.9 %  sodium chloride infusion ( Intravenous New Bag/Given 06/26/21 1403)  sodium chloride 0.9 % bolus 1,000 mL (0 mLs Intravenous Stopped 06/26/21 1601)    ED Course  I have reviewed the triage vital signs and the nursing notes.  Pertinent labs & imaging results that were available during my care of the patient were reviewed by me and considered in my medical decision making (see chart for details).  Cardiac 80s sinus normal Pulse ox 99% room air normal  Initial labs notable for evidence for dehydration, fluids provided.  7:43 PM Patient improved, no ongoing complaints.  EKG reviewed, no sustained arrhythmia here.  On cardiac monitor for hours she has had no arrhythmia similarly.  Some suspicion for this contributing the patient's episode given his description of lightheadedness, prior to losing consciousness.  Given otherwise reassuring findings, reassuring CT, he has no weakness currently, denied any previously, emergent MRI  not indicated.  Patient amenable to close outpatient follow-up with his physician, home fluid resuscitation.  Patient discharged in stable condition peer MDM Rules/Calculators/A&P MDM Number of Diagnoses or Management Options Syncope and collapse: new, needed workup   Amount and/or  Complexity of Data Reviewed Clinical lab tests: ordered and reviewed Tests in the radiology section of CPT: ordered and reviewed Tests in the medicine section of CPT: reviewed and ordered Decide to obtain previous medical records or to obtain history from someone other than the patient: yes Review and summarize past medical records: yes Independent visualization of images, tracings, or specimens: yes  Risk of Complications, Morbidity, and/or Mortality Presenting problems: high Diagnostic procedures: high Management options: high  Critical Care Total time providing critical care: < 30 minutes  Patient Progress Patient progress: improved   Final Clinical Impression(s) / ED Diagnoses Final diagnoses:  Syncope and collapse     Carmin Muskrat, MD 06/26/21 1945

## 2021-08-11 ENCOUNTER — Ambulatory Visit (INDEPENDENT_AMBULATORY_CARE_PROVIDER_SITE_OTHER): Payer: Medicare HMO | Admitting: Podiatry

## 2021-08-11 ENCOUNTER — Other Ambulatory Visit: Payer: Self-pay

## 2021-08-11 ENCOUNTER — Encounter: Payer: Self-pay | Admitting: Podiatry

## 2021-08-11 DIAGNOSIS — M79674 Pain in right toe(s): Secondary | ICD-10-CM

## 2021-08-11 DIAGNOSIS — E1149 Type 2 diabetes mellitus with other diabetic neurological complication: Secondary | ICD-10-CM | POA: Diagnosis not present

## 2021-08-11 DIAGNOSIS — B351 Tinea unguium: Secondary | ICD-10-CM | POA: Diagnosis not present

## 2021-08-11 DIAGNOSIS — M79675 Pain in left toe(s): Secondary | ICD-10-CM | POA: Diagnosis not present

## 2021-08-11 NOTE — Progress Notes (Signed)
This patient returns to my office for at risk foot care.  This patient requires this care by a professional since this patient will be at risk due to having diabetes.    This patient is unable to cut nails himself since the patient cannot reach his nails.These nails are painful walking and wearing shoes. This patient presents to the office with his wife. This patient presents for at risk foot care today.  General Appearance  Alert, conversant and in no acute stress.  Vascular  Dorsalis pedis and posterior tibial  pulses are not  palpable  bilaterally.  Capillary return is within normal limits  bilaterally. Temperature is within normal limits  bilaterally.  Neurologic  Senn-Weinstein monofilament wire test diminished   bilaterally. Muscle power within normal limits bilaterally.  Nails Thick disfigured discolored nails with subungual debris  from hallux to fifth toes bilaterally. No evidence of bacterial infection or drainage bilaterally.  Orthopedic  No limitations of motion  feet .  No crepitus or effusions noted.  No bony pathology or digital deformities noted.  Skin  normotropic skin with no porokeratosis noted bilaterally.  No signs of infections or ulcers noted.     Onychomycosis  Pain in right toes  Pain in left toes  Consent was obtained for treatment procedures.   Mechanical debridement of nails 1-5  bilaterally performed with a nail nipper.  Filed with dremel without incident.    Return office visit    3 months                 Told patient to return for periodic foot care and evaluation due to potential at risk complications.   Gardiner Barefoot DPM

## 2021-11-10 ENCOUNTER — Ambulatory Visit: Payer: Medicare HMO | Admitting: Podiatry

## 2021-11-26 ENCOUNTER — Ambulatory Visit (INDEPENDENT_AMBULATORY_CARE_PROVIDER_SITE_OTHER): Payer: Medicare HMO | Admitting: Podiatry

## 2021-11-26 ENCOUNTER — Encounter: Payer: Self-pay | Admitting: Podiatry

## 2021-11-26 DIAGNOSIS — B351 Tinea unguium: Secondary | ICD-10-CM

## 2021-11-26 DIAGNOSIS — M79675 Pain in left toe(s): Secondary | ICD-10-CM

## 2021-11-26 DIAGNOSIS — E1149 Type 2 diabetes mellitus with other diabetic neurological complication: Secondary | ICD-10-CM

## 2021-11-26 DIAGNOSIS — M79674 Pain in right toe(s): Secondary | ICD-10-CM

## 2021-11-26 NOTE — Progress Notes (Signed)
This patient returns to my office for at risk foot care.  This patient requires this care by a professional since this patient will be at risk due to having diabetes.    This patient is unable to cut nails himself since the patient cannot reach his nails.These nails are painful walking and wearing shoes. This patient presents to the office with his wife. This patient presents for at risk foot care today.  General Appearance  Alert, conversant and in no acute stress.  Vascular  Dorsalis pedis and posterior tibial  pulses are not  palpable  bilaterally.  Capillary return is within normal limits  bilaterally. Temperature is within normal limits  bilaterally.  Neurologic  Senn-Weinstein monofilament wire test diminished   bilaterally. Muscle power within normal limits bilaterally.  Nails Thick disfigured discolored nails with subungual debris  from hallux to fifth toes bilaterally. No evidence of bacterial infection or drainage bilaterally.  Orthopedic  No limitations of motion  feet .  No crepitus or effusions noted.  No bony pathology or digital deformities noted.  Skin  normotropic skin with no porokeratosis noted bilaterally.  No signs of infections or ulcers noted.     Onychomycosis  Pain in right toes  Pain in left toes  Consent was obtained for treatment procedures.   Mechanical debridement of nails 1-5  bilaterally performed with a nail nipper.  Filed with dremel without incident.    Return office visit   10 weeks                 Told patient to return for periodic foot care and evaluation due to potential at risk complications.   Annell Canty DPM  

## 2022-02-07 ENCOUNTER — Ambulatory Visit: Payer: Medicare HMO | Admitting: Podiatry

## 2022-03-01 ENCOUNTER — Ambulatory Visit (INDEPENDENT_AMBULATORY_CARE_PROVIDER_SITE_OTHER): Payer: Medicare HMO | Admitting: Podiatry

## 2022-03-01 ENCOUNTER — Encounter: Payer: Self-pay | Admitting: Podiatry

## 2022-03-01 DIAGNOSIS — M79675 Pain in left toe(s): Secondary | ICD-10-CM | POA: Diagnosis not present

## 2022-03-01 DIAGNOSIS — M79674 Pain in right toe(s): Secondary | ICD-10-CM

## 2022-03-01 DIAGNOSIS — B351 Tinea unguium: Secondary | ICD-10-CM | POA: Diagnosis not present

## 2022-03-01 DIAGNOSIS — E1149 Type 2 diabetes mellitus with other diabetic neurological complication: Secondary | ICD-10-CM | POA: Diagnosis not present

## 2022-03-01 NOTE — Progress Notes (Signed)
This patient returns to my office for at risk foot care.  This patient requires this care by a professional since this patient will be at risk due to having diabetes.    This patient is unable to cut nails himself since the patient cannot reach his nails.These nails are painful walking and wearing shoes. This patient presents to the office with his wife. This patient presents for at risk foot care today.  General Appearance  Alert, conversant and in no acute stress.  Vascular  Dorsalis pedis and posterior tibial  pulses are not  palpable  bilaterally.  Capillary return is within normal limits  bilaterally. Temperature is within normal limits  bilaterally.  Neurologic  Senn-Weinstein monofilament wire test diminished   bilaterally. Muscle power within normal limits bilaterally.  Nails Thick disfigured discolored nails with subungual debris  from hallux to fifth toes bilaterally. No evidence of bacterial infection or drainage bilaterally.  Orthopedic  No limitations of motion  feet .  No crepitus or effusions noted.  No bony pathology or digital deformities noted.  Skin  normotropic skin with no porokeratosis noted bilaterally.  No signs of infections or ulcers noted.     Onychomycosis  Pain in right toes  Pain in left toes  Consent was obtained for treatment procedures.   Mechanical debridement of nails 1-5  bilaterally performed with a nail nipper.  Filed with dremel without incident.    Return office visit   10 weeks                 Told patient to return for periodic foot care and evaluation due to potential at risk complications.   Suhaib Guzzo DPM  

## 2022-05-09 ENCOUNTER — Ambulatory Visit (INDEPENDENT_AMBULATORY_CARE_PROVIDER_SITE_OTHER): Payer: Medicare HMO | Admitting: Podiatry

## 2022-05-09 ENCOUNTER — Encounter: Payer: Self-pay | Admitting: Podiatry

## 2022-05-09 DIAGNOSIS — E1149 Type 2 diabetes mellitus with other diabetic neurological complication: Secondary | ICD-10-CM

## 2022-05-09 DIAGNOSIS — M79675 Pain in left toe(s): Secondary | ICD-10-CM

## 2022-05-09 DIAGNOSIS — M79674 Pain in right toe(s): Secondary | ICD-10-CM | POA: Diagnosis not present

## 2022-05-09 DIAGNOSIS — B351 Tinea unguium: Secondary | ICD-10-CM

## 2022-05-09 DIAGNOSIS — E11311 Type 2 diabetes mellitus with unspecified diabetic retinopathy with macular edema: Secondary | ICD-10-CM

## 2022-05-09 NOTE — Progress Notes (Signed)
This patient returns to my office for at risk foot care.  This patient requires this care by a professional since this patient will be at risk due to having diabetes.    This patient is unable to cut nails himself since the patient cannot reach his nails.These nails are painful walking and wearing shoes. This patient presents to the office with his wife. This patient presents for at risk foot care today.  General Appearance  Alert, conversant and in no acute stress.  Vascular  Dorsalis pedis and posterior tibial  pulses are not  palpable  bilaterally.  Capillary return is within normal limits  bilaterally. Temperature is within normal limits  bilaterally.  Neurologic  Senn-Weinstein monofilament wire test diminished   bilaterally. Muscle power within normal limits bilaterally.  Nails Thick disfigured discolored nails with subungual debris  from hallux to fifth toes bilaterally. No evidence of bacterial infection or drainage bilaterally.  Orthopedic  No limitations of motion  feet .  No crepitus or effusions noted.  No bony pathology or digital deformities noted.  Skin  normotropic skin with no porokeratosis noted bilaterally.  No signs of infections or ulcers noted.     Onychomycosis  Pain in right toes  Pain in left toes  Consent was obtained for treatment procedures.   Mechanical debridement of nails 1-5  bilaterally performed with a nail nipper.  Filed with dremel without incident.    Return office visit   10 weeks                 Told patient to return for periodic foot care and evaluation due to potential at risk complications.   Halina Asano DPM  

## 2022-07-20 ENCOUNTER — Encounter: Payer: Self-pay | Admitting: Podiatry

## 2022-07-20 ENCOUNTER — Ambulatory Visit (INDEPENDENT_AMBULATORY_CARE_PROVIDER_SITE_OTHER): Payer: Medicare HMO | Admitting: Podiatry

## 2022-07-20 DIAGNOSIS — E11311 Type 2 diabetes mellitus with unspecified diabetic retinopathy with macular edema: Secondary | ICD-10-CM

## 2022-07-20 DIAGNOSIS — M79674 Pain in right toe(s): Secondary | ICD-10-CM

## 2022-07-20 DIAGNOSIS — B351 Tinea unguium: Secondary | ICD-10-CM | POA: Diagnosis not present

## 2022-07-20 DIAGNOSIS — E1149 Type 2 diabetes mellitus with other diabetic neurological complication: Secondary | ICD-10-CM | POA: Diagnosis not present

## 2022-07-20 DIAGNOSIS — M79675 Pain in left toe(s): Secondary | ICD-10-CM

## 2022-07-20 NOTE — Progress Notes (Signed)
This patient returns to my office for at risk foot care.  This patient requires this care by a professional since this patient will be at risk due to having diabetes.    This patient is unable to cut nails himself since the patient cannot reach his nails.These nails are painful walking and wearing shoes. This patient presents to the office with his wife. This patient presents for at risk foot care today.  General Appearance  Alert, conversant and in no acute stress.  Vascular  Dorsalis pedis and posterior tibial  pulses are not  palpable  bilaterally.  Capillary return is within normal limits  bilaterally. Temperature is within normal limits  bilaterally.  Neurologic  Senn-Weinstein monofilament wire test diminished   bilaterally. Muscle power within normal limits bilaterally.  Nails Thick disfigured discolored nails with subungual debris  from hallux to fifth toes bilaterally. No evidence of bacterial infection or drainage bilaterally.  Orthopedic  No limitations of motion  feet .  No crepitus or effusions noted.  No bony pathology or digital deformities noted.  Skin  normotropic skin with no porokeratosis noted bilaterally.  No signs of infections or ulcers noted.     Onychomycosis  Pain in right toes  Pain in left toes  Consent was obtained for treatment procedures.   Mechanical debridement of nails 1-5  bilaterally performed with a nail nipper.  Filed with dremel without incident.    Return office visit   10 weeks                 Told patient to return for periodic foot care and evaluation due to potential at risk complications.   Gardiner Barefoot DPM

## 2022-10-26 ENCOUNTER — Ambulatory Visit: Payer: Medicare PPO | Admitting: Podiatry

## 2022-11-23 ENCOUNTER — Ambulatory Visit: Payer: Medicare PPO | Admitting: Podiatry

## 2022-11-25 ENCOUNTER — Encounter: Payer: Self-pay | Admitting: Podiatry

## 2022-11-25 ENCOUNTER — Ambulatory Visit (INDEPENDENT_AMBULATORY_CARE_PROVIDER_SITE_OTHER): Payer: Medicare PPO | Admitting: Podiatry

## 2022-11-25 DIAGNOSIS — E1149 Type 2 diabetes mellitus with other diabetic neurological complication: Secondary | ICD-10-CM | POA: Diagnosis not present

## 2022-11-25 DIAGNOSIS — M79675 Pain in left toe(s): Secondary | ICD-10-CM | POA: Diagnosis not present

## 2022-11-25 DIAGNOSIS — M79674 Pain in right toe(s): Secondary | ICD-10-CM

## 2022-11-25 DIAGNOSIS — B351 Tinea unguium: Secondary | ICD-10-CM

## 2022-11-25 NOTE — Progress Notes (Signed)
This patient returns to my office for at risk foot care.  This patient requires this care by a professional since this patient will be at risk due to having diabetes.    This patient is unable to cut nails himself since the patient cannot reach his nails.These nails are painful walking and wearing shoes. This patient presents to the office with his wife. This patient presents for at risk foot care today.  General Appearance  Alert, conversant and in no acute stress.  Vascular  Dorsalis pedis and posterior tibial  pulses are not  palpable  bilaterally.  Capillary return is within normal limits  bilaterally. Temperature is within normal limits  bilaterally.  Neurologic  Senn-Weinstein monofilament wire test diminished   bilaterally. Muscle power within normal limits bilaterally.  Nails Thick disfigured discolored nails with subungual debris  from hallux to fifth toes bilaterally. No evidence of bacterial infection or drainage bilaterally.  Orthopedic  No limitations of motion  feet .  No crepitus or effusions noted.  No bony pathology or digital deformities noted.  Skin  normotropic skin with no porokeratosis noted bilaterally.  No signs of infections or ulcers noted.     Onychomycosis  Pain in right toes  Pain in left toes  Consent was obtained for treatment procedures.   Mechanical debridement of nails 1-5  bilaterally performed with a nail nipper.  Filed with dremel without incident.    Return office visit   12   weeks                 Told patient to return for periodic foot care and evaluation due to potential at risk complications.   Helane Gunther DPM

## 2023-03-02 ENCOUNTER — Encounter: Payer: Self-pay | Admitting: Podiatry

## 2023-03-02 ENCOUNTER — Ambulatory Visit (INDEPENDENT_AMBULATORY_CARE_PROVIDER_SITE_OTHER): Payer: Medicare PPO | Admitting: Podiatry

## 2023-03-02 DIAGNOSIS — E1149 Type 2 diabetes mellitus with other diabetic neurological complication: Secondary | ICD-10-CM | POA: Diagnosis not present

## 2023-03-02 DIAGNOSIS — M79675 Pain in left toe(s): Secondary | ICD-10-CM | POA: Diagnosis not present

## 2023-03-02 DIAGNOSIS — B351 Tinea unguium: Secondary | ICD-10-CM | POA: Diagnosis not present

## 2023-03-02 DIAGNOSIS — M79674 Pain in right toe(s): Secondary | ICD-10-CM

## 2023-03-02 NOTE — Progress Notes (Signed)
This patient returns to my office for at risk foot care.  This patient requires this care by a professional since this patient will be at risk due to having diabetes.    This patient is unable to cut nails himself since the patient cannot reach his nails.These nails are painful walking and wearing shoes. This patient presents to the office with his wife. This patient presents for at risk foot care today.  General Appearance  Alert, conversant and in no acute stress.  Vascular  Dorsalis pedis and posterior tibial  pulses are not  palpable  bilaterally.  Capillary return is within normal limits  bilaterally. Temperature is within normal limits  bilaterally.  Neurologic  Senn-Weinstein monofilament wire test diminished   bilaterally. Muscle power within normal limits bilaterally.  Nails Thick disfigured discolored nails with subungual debris  from hallux to fifth toes bilaterally. No evidence of bacterial infection or drainage bilaterally.  Orthopedic  No limitations of motion  feet .  No crepitus or effusions noted.  No bony pathology or digital deformities noted.  Skin  normotropic skin with no porokeratosis noted bilaterally.  No signs of infections or ulcers noted.     Onychomycosis  Pain in right toes  Pain in left toes  Consent was obtained for treatment procedures.   Mechanical debridement of nails 1-5  bilaterally performed with a nail nipper.  Filed with dremel without incident.    Return office visit   12   weeks                 Told patient to return for periodic foot care and evaluation due to potential at risk complications.   Helane Gunther DPM

## 2023-06-02 ENCOUNTER — Ambulatory Visit (INDEPENDENT_AMBULATORY_CARE_PROVIDER_SITE_OTHER): Payer: Medicare PPO | Admitting: Podiatry

## 2023-06-02 ENCOUNTER — Encounter: Payer: Self-pay | Admitting: Podiatry

## 2023-06-02 VITALS — Ht 68.0 in | Wt 184.0 lb

## 2023-06-02 DIAGNOSIS — M79675 Pain in left toe(s): Secondary | ICD-10-CM | POA: Diagnosis not present

## 2023-06-02 DIAGNOSIS — M79674 Pain in right toe(s): Secondary | ICD-10-CM

## 2023-06-02 DIAGNOSIS — E1149 Type 2 diabetes mellitus with other diabetic neurological complication: Secondary | ICD-10-CM

## 2023-06-02 DIAGNOSIS — B351 Tinea unguium: Secondary | ICD-10-CM | POA: Diagnosis not present

## 2023-06-02 NOTE — Progress Notes (Signed)
This patient returns to my office for at risk foot care.  This patient requires this care by a professional since this patient will be at risk due to having diabetes.    This patient is unable to cut nails himself since the patient cannot reach his nails.These nails are painful walking and wearing shoes. This patient presents to the office with his wife. This patient presents for at risk foot care today.  General Appearance  Alert, conversant and in no acute stress.  Vascular  Dorsalis pedis and posterior tibial  pulses are not  palpable  bilaterally.  Capillary return is within normal limits  bilaterally. Temperature is within normal limits  bilaterally.  Neurologic  Senn-Weinstein monofilament wire test diminished   bilaterally. Muscle power within normal limits bilaterally.  Nails Thick disfigured discolored nails with subungual debris  from hallux to fifth toes bilaterally. No evidence of bacterial infection or drainage bilaterally.  Orthopedic  No limitations of motion  feet .  No crepitus or effusions noted.  No bony pathology or digital deformities noted.  Skin  normotropic skin with no porokeratosis noted bilaterally.  No signs of infections or ulcers noted.     Onychomycosis  Pain in right toes  Pain in left toes  Consent was obtained for treatment procedures.   Mechanical debridement of nails 1-5  bilaterally performed with a nail nipper.  Filed with dremel without incident.    Return office visit   12   weeks                 Told patient to return for periodic foot care and evaluation due to potential at risk complications.   Helane Gunther DPM

## 2023-07-23 ENCOUNTER — Emergency Department (HOSPITAL_COMMUNITY): Payer: Medicare PPO

## 2023-07-23 ENCOUNTER — Emergency Department (HOSPITAL_COMMUNITY)
Admission: EM | Admit: 2023-07-23 | Discharge: 2023-07-23 | Disposition: A | Discharge status: Home / Self Care | Payer: Medicare PPO | Attending: Emergency Medicine | Admitting: Emergency Medicine

## 2023-07-23 ENCOUNTER — Other Ambulatory Visit: Payer: Self-pay

## 2023-07-23 DIAGNOSIS — Z7984 Long term (current) use of oral hypoglycemic drugs: Secondary | ICD-10-CM | POA: Insufficient documentation

## 2023-07-23 DIAGNOSIS — R4182 Altered mental status, unspecified: Secondary | ICD-10-CM | POA: Diagnosis present

## 2023-07-23 DIAGNOSIS — Z79899 Other long term (current) drug therapy: Secondary | ICD-10-CM | POA: Insufficient documentation

## 2023-07-23 LAB — CBC WITH DIFFERENTIAL/PLATELET
Abs Immature Granulocytes: 0.01 10*3/uL (ref 0.00–0.07)
Basophils Absolute: 0 10*3/uL (ref 0.0–0.1)
Basophils Relative: 0 %
Eosinophils Absolute: 0.1 10*3/uL (ref 0.0–0.5)
Eosinophils Relative: 1 %
HCT: 38.2 % — ABNORMAL LOW (ref 39.0–52.0)
Hemoglobin: 13.6 g/dL (ref 13.0–17.0)
Immature Granulocytes: 0 %
Lymphocytes Relative: 15 %
Lymphs Abs: 0.8 10*3/uL (ref 0.7–4.0)
MCH: 27.9 pg (ref 26.0–34.0)
MCHC: 35.6 g/dL (ref 30.0–36.0)
MCV: 78.3 fL — ABNORMAL LOW (ref 80.0–100.0)
Monocytes Absolute: 0.5 10*3/uL (ref 0.1–1.0)
Monocytes Relative: 10 %
Neutro Abs: 3.6 10*3/uL (ref 1.7–7.7)
Neutrophils Relative %: 74 %
Platelets: 173 10*3/uL (ref 150–400)
RBC: 4.88 MIL/uL (ref 4.22–5.81)
RDW: 16.1 % — ABNORMAL HIGH (ref 11.5–15.5)
WBC: 5 10*3/uL (ref 4.0–10.5)
nRBC: 0 % (ref 0.0–0.2)

## 2023-07-23 LAB — COMPREHENSIVE METABOLIC PANEL
ALT: 19 U/L (ref 0–44)
AST: 21 U/L (ref 15–41)
Albumin: 3.2 g/dL — ABNORMAL LOW (ref 3.5–5.0)
Alkaline Phosphatase: 89 U/L (ref 38–126)
Anion gap: 8 (ref 5–15)
BUN: 14 mg/dL (ref 8–23)
CO2: 22 mmol/L (ref 22–32)
Calcium: 8.8 mg/dL — ABNORMAL LOW (ref 8.9–10.3)
Chloride: 105 mmol/L (ref 98–111)
Creatinine, Ser: 1.09 mg/dL (ref 0.61–1.24)
GFR, Estimated: >60 mL/min (ref >60)
Glucose, Bld: 212 mg/dL — ABNORMAL HIGH (ref 70–99)
Potassium: 4.5 mmol/L (ref 3.5–5.1)
Sodium: 135 mmol/L (ref 135–145)
Total Bilirubin: 0.6 mg/dL (ref <1.2)
Total Protein: 6.8 g/dL (ref 6.5–8.1)

## 2023-07-23 LAB — URINALYSIS, ROUTINE W REFLEX MICROSCOPIC
Bilirubin Urine: NEGATIVE
Glucose, UA: 50 mg/dL — AB
Hgb urine dipstick: NEGATIVE
Ketones, ur: NEGATIVE mg/dL
Leukocytes,Ua: NEGATIVE
Nitrite: NEGATIVE
Protein, ur: 100 mg/dL — AB
Specific Gravity, Urine: 1.018 (ref 1.005–1.030)
pH: 5 (ref 5.0–8.0)

## 2023-07-23 LAB — RAPID URINE DRUG SCREEN, HOSP PERFORMED
Amphetamines: NOT DETECTED
Barbiturates: NOT DETECTED
Benzodiazepines: NOT DETECTED
Cocaine: NOT DETECTED
Opiates: NOT DETECTED
Tetrahydrocannabinol: NOT DETECTED

## 2023-07-23 LAB — I-STAT VENOUS BLOOD GAS, ED
Acid-base deficit: 2 mmol/L (ref 0.0–2.0)
Bicarbonate: 24.3 mmol/L (ref 20.0–28.0)
Calcium, Ion: 1.2 mmol/L (ref 1.15–1.40)
HCT: 39 % (ref 39.0–52.0)
Hemoglobin: 13.3 g/dL (ref 13.0–17.0)
O2 Saturation: 73 %
Potassium: 4.5 mmol/L (ref 3.5–5.1)
Sodium: 139 mmol/L (ref 135–145)
TCO2: 26 mmol/L (ref 22–32)
pCO2, Ven: 46.9 mm[Hg] (ref 44–60)
pH, Ven: 7.322 (ref 7.25–7.43)
pO2, Ven: 42 mm[Hg] (ref 32–45)

## 2023-07-23 LAB — CBG MONITORING, ED: Glucose-Capillary: 216 mg/dL — ABNORMAL HIGH (ref 70–99)

## 2023-07-23 LAB — ETHANOL: Alcohol, Ethyl (B): <10 mg/dL (ref <10)

## 2023-07-23 LAB — AMMONIA: Ammonia: 15 umol/L (ref 9–35)

## 2023-07-23 LAB — I-STAT CG4 LACTIC ACID, ED: Lactic Acid, Venous: 1.4 mmol/L (ref 0.5–1.9)

## 2023-07-23 LAB — LIPASE, BLOOD: Lipase: 33 U/L (ref 11–51)

## 2023-07-23 MED ORDER — ONDANSETRON HCL 4 MG/2ML IJ SOLN: 4.0000 mg | Freq: Once | INTRAMUSCULAR | Status: DC

## 2023-07-23 MED ORDER — SODIUM CHLORIDE 0.9 % IV BOLUS
1000.0000 mL | Freq: Once | INTRAVENOUS | Status: AC
  Administered 2023-07-23: 1000 mL via INTRAVENOUS

## 2023-07-23 NOTE — ED Notes (Signed)
Pt d/c home to [facility] per EDP order. Pt brother who is at bedside reports he is driving pt back home to facility. This RN spoke with Selena Batten at [facility]. Discharge summary reviewed, NAD.

## 2023-07-23 NOTE — ED Provider Notes (Signed)
[place] EMERGENCY DEPARTMENT AT [facility] Provider Note   CSN: [id number] Arrival date & time: 07/23/23  1312     History  Chief Complaint  Patient presents with   Altered Mental Status    Christopher Rivas is a 72 y.o. male.  72 yo M with a chief complaints of altered mental status.  Was noted not to be at his baseline per his family member at church today and so sent here for evaluation.  The patient tells me that he has not really been feeling like eating or drinking.  He is not really sure why.  Denies any nausea vomiting or diarrhea.  Denies any abdominal pain denies chest pain denies cough congestion or fever.  Denies headaches.  Denies one-sided numbness or weakness denies difficulty speech or swallowing.   Altered Mental Status      Home Medications Prior to Admission medications   Medication Sig Start Date End Date Taking? Authorizing Provider  ACCU-CHEK AVIVA PLUS test strip 2 (two) times daily. for testing 10/14/14   [provider]  Alogliptin-Pioglitazone (OSENI) 25-15 MG TABS Take 1 tablet by mouth daily.     [provider]  amLODipine (NORVASC) 10 MG tablet Take by mouth.    [provider]  brimonidine-timolol (COMBIGAN) 0.2-0.5 % ophthalmic solution Apply to eye. 06/27/19   [provider]  dorzolamide (TRUSOPT) 2 % ophthalmic solution Apply to eye. 06/27/19   [provider]  dorzolamide (TRUSOPT) 2 % ophthalmic solution Apply to eye. 11/11/20   [provider]  fluorometholone (FML FORTE) 0.25 % ophthalmic suspension Apply to eye.    [provider]  fluticasone (FLONASE) 50 MCG/ACT nasal spray Place 2 sprays into both nostrils daily for 10 days. 10/11/18 10/21/18  Leonette Monarch, MD  gabapentin (NEURONTIN) 300 MG capsule Take by mouth.    [provider]  glimepiride (AMARYL) 2 MG tablet Take by mouth.    [provider]  hydrochlorothiazide (HYDRODIURIL) 25 MG tablet Take  by mouth.    [provider]  latanoprost (XALATAN) 0.005 % ophthalmic solution Apply to eye. 11/18/19   [provider]  latanoprost (XALATAN) 0.005 % ophthalmic solution Apply to eye. 08/25/20   [provider]  LEVEMIR FLEXTOUCH 100 UNIT/ML Pen  09/30/14   [provider]  lisinopril (ZESTRIL) 10 MG tablet Take by mouth. 07/10/13   [provider]  loteprednol (LOTEMAX) 0.5 % ophthalmic suspension Place 1 drop into both eyes 3 (three) times daily.    [provider]  lovastatin (MEVACOR) 20 MG tablet Take by mouth.    [provider]  metFORMIN (GLUCOPHAGE) 500 MG tablet Take 500 mg by mouth 2 (two) times daily with a meal.    [provider]  NOVOFINE 32G X 6 MM MISC  10/01/14   [provider]  pioglitazone (ACTOS) 45 MG tablet  11/21/19   [provider]  prednisoLONE acetate (PRED FORTE) 1 % ophthalmic suspension Apply to eye. 04/23/14   [provider]  sitaGLIPtin-metformin (JANUMET) 50-1000 MG tablet Take 1 tablet by mouth every morning. 08/04/14   [provider]  triamcinolone cream (KENALOG) 0.1 %  11/25/19   [provider]      Allergies    Patient has no known allergies.    Review of Systems   Review of Systems  Physical Exam Updated Vital Signs BP 131/82 (BP Location: Left Arm)   Pulse 79   Temp 98.5 F (36.9 C) (  Oral)   Resp 16   Ht 5\' 8"  (1.727 m)   Wt 75.8 kg   SpO2 98%   BMI 25.39 kg/m  Physical Exam Vitals and nursing note reviewed.  Constitutional:      Appearance: He is well-developed.  HENT:     Head: Normocephalic and atraumatic.  Eyes:     Pupils: Pupils are equal, round, and reactive to light.  Neck:     Vascular: No JVD.  Cardiovascular:     Rate and Rhythm: Normal rate and regular rhythm.     Heart sounds: No murmur heard.    No friction rub. No gallop.  Pulmonary:     Effort: No respiratory distress.     Breath sounds: No wheezing.   Abdominal:     General: There is no distension.     Tenderness: There is no abdominal tenderness. There is no guarding or rebound.  Musculoskeletal:        General: Normal range of motion.     Cervical back: Normal range of motion and neck supple.  Skin:    Coloration: Skin is not pale.     Findings: No rash.  Neurological:     Mental Status: He is alert and oriented to person, place, and time.     Comments: No appreciable weakness in all 4 extremities.  Psychiatric:        Behavior: Behavior normal.     ED Results / Procedures / Treatments   Labs (all labs ordered are listed, but only abnormal results are displayed) Labs Reviewed  COMPREHENSIVE METABOLIC PANEL - Abnormal; Notable for the following components:      Result Value   Glucose, Bld 212 (*)    Calcium 8.8 (*)    Albumin 3.2 (*)    All other components within normal limits  CBC WITH DIFFERENTIAL/PLATELET - Abnormal; Notable for the following components:   HCT 38.2 (*)    MCV 78.3 (*)    RDW 16.1 (*)    All other components within normal limits  CBG MONITORING, ED - Abnormal; Notable for the following components:   Glucose-Capillary 216 (*)    All other components within normal limits  CULTURE, BLOOD (ROUTINE X 2)  CULTURE, BLOOD (ROUTINE X 2)  AMMONIA  ETHANOL  LIPASE, BLOOD  RAPID URINE DRUG SCREEN, HOSP PERFORMED  URINALYSIS, ROUTINE W REFLEX MICROSCOPIC  CBG MONITORING, ED  I-STAT CG4 LACTIC ACID, ED  I-STAT VENOUS BLOOD GAS, ED    EKG EKG Interpretation Date/Time:  Sunday July 23 2023 13:31:43 EST Ventricular Rate:  82 PR Interval:  119 QRS Duration:  79 QT Interval:  355 QTC Calculation: 415 R Axis:   41  Text Interpretation: Sinus rhythm Borderline short PR interval Borderline T abnormalities, inferior leads No significant change since last tracing Confirmed by Melene Plan [phone]) on 07/23/2023 1:32:58 PM  Radiology CT HEAD WO CONTRAST Result Date: 07/23/2023 CLINICAL DATA:  Mental  status change, persistent or worsening. EXAM: CT HEAD WITHOUT CONTRAST TECHNIQUE: Contiguous axial images were obtained from the base of the skull through the vertex without intravenous contrast. RADIATION DOSE REDUCTION: This exam was performed according to the departmental dose-optimization program which includes automated exposure control, adjustment of the mA and/or kV according to patient size and/or use of iterative reconstruction technique. COMPARISON:  CT head without contrast 06/26/2021. FINDINGS: Brain: Advanced atrophy and diffuse white matter disease is similar the prior exam. Multiple remote lacunar infarcts of the basal ganglia and corona radiata are stable  bilaterally. Remote lacunar infarcts of the thalami bilaterally are stable. A remote right PICA territory infarct is present. Brainstem and cerebellum are otherwise within normal limits. The ventricles are proportionate to the degree of atrophy. No significant extraaxial fluid collection is present. Midline structures are within normal limits. Vascular: Dense atherosclerotic calcifications are present within the cavernous internal carotid arteries bilaterally. No hyperdense vessel is present. Skull: Calvarium is intact. No focal lytic or blastic lesions are present. No significant extracranial soft tissue lesion is present. Sinuses/Orbits: The paranasal sinuses and mastoid air cells are clear. Scleral banding is noted bilaterally. Bilateral inter sections are noted. Globes and orbits are otherwise within normal limits. IMPRESSION: 1. No acute intracranial abnormality or significant interval change. 2. Advanced atrophy and diffuse white matter disease likely reflects the sequela of chronic microvascular ischemia. 3. 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 4. Remote right PICA territory infarct. Electronically Signed   By: Marin Roberts M.D.   On: 07/23/2023 14:59   DG Chest Port 1 View Result  Date: 07/23/2023 CLINICAL DATA:  Altered mental status. EXAM: PORTABLE CHEST 1 VIEW COMPARISON:  06/26/2021. FINDINGS: Low lung volume. Bilateral lung fields are clear. Bilateral costophrenic angles are clear. Normal cardio-mediastinal silhouette. No acute osseous abnormalities. The soft tissues are within normal limits. IMPRESSION: No active disease. Electronically Signed   By: Jules Schick M.D.   On: 07/23/2023 14:58    Procedures Procedures    Medications Ordered in ED Medications  ondansetron (ZOFRAN) injection 4 mg (has no administration in time range)  sodium chloride 0.9 % bolus 1,000 mL (1,000 mLs Intravenous New Bag/Given 07/23/23 1420)    ED Course/ Medical Decision Making/ A&P                                 Medical Decision Making Amount and/or Complexity of Data Reviewed Labs: ordered. Radiology: ordered.  Risk Prescription drug management.   72 yo  M with a chief complaints of altered mental status.  This was reported to the EMS providers who brought him here.  Says like he was at church today and a family member felt like he was not at his baseline.  Patient has significant trouble with his vision at baseline.  Difficult doing a full neuroexam but the patient seems pretty cognizant and tells me that he just has not felt like eating and drinking for the past couple days.  He is not really able to describe why.  Will obtain a laboratory evaluation bolus of IV fluids antiemetics reassess.  No significant electrolyte abnormality.  No acute anemia.  Lactate is normal ammonia levels normal.  CT of the head without obvious acute intracranial pathology.  Awaiting UA.  Signed the patient out to Dr. Deretha Emory, awaiting UA oral trial.  The patients results and plan were reviewed and discussed.   Any x-rays performed were independently reviewed by myself.   Differential diagnosis were considered with the presenting HPI.  Medications  ondansetron (ZOFRAN) injection 4 mg (has  no administration in time range)  sodium chloride 0.9 % bolus 1,000 mL (1,000 mLs Intravenous New Bag/Given 07/23/23 1420)    Vitals:   07/23/23 1330 07/23/23 1332  BP:  131/82  Pulse:  79  Resp:  16  Temp:  98.5 F (36.9 C)  TempSrc:  Oral  SpO2:  98%  Weight: 75.8 kg   Height: 5\' 8"  (1.727 m)  Final diagnoses:  Altered mental status, unspecified altered mental status type            Final Clinical Impression(s) / ED Diagnoses Final diagnoses:  Altered mental status, unspecified altered mental status type    Rx / DC Orders ED Discharge Orders     None         Melene Plan, DO 07/23/23 1545

## 2023-07-23 NOTE — ED Notes (Signed)
On assessment pt is A&O X4, majority of pt speech is clear, pt does stutter a few words

## 2023-07-23 NOTE — Discharge Instructions (Addendum)
Follow-up with your doctors.  Workup here without any acute findings.  Return for any new or worse symptoms.

## 2023-07-23 NOTE — ED Notes (Signed)
Encouraged pt to provide urine sample per EDP order

## 2023-07-23 NOTE — ED Provider Notes (Signed)
Patient according to friend is back to baseline.  He wants to go home.  Lab workup without any acute findings.  Patient is hungry and wants to eat.  Will discharge.   Vanetta Mulders, MD 07/23/23 [phone]

## 2023-07-23 NOTE — ED Triage Notes (Signed)
Pt to ED via [facility] from church , pt is a resident at [facility] nursing facility, here today c/o AMS. 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 94%, 79pulse, 264cbg

## 2023-07-28 LAB — CULTURE, BLOOD (ROUTINE X 2): Culture: NO GROWTH

## 2023-07-29 ENCOUNTER — Emergency Department (HOSPITAL_COMMUNITY)
Admission: EM | Admit: 2023-07-29 | Discharge: 2023-07-30 | Disposition: A | Discharge status: Skilled Nursing Facility | Payer: Medicare PPO | Attending: Emergency Medicine | Admitting: Emergency Medicine

## 2023-07-29 ENCOUNTER — Other Ambulatory Visit: Payer: Self-pay

## 2023-07-29 ENCOUNTER — Encounter (HOSPITAL_COMMUNITY): Payer: Self-pay

## 2023-07-29 DIAGNOSIS — R339 Retention of urine, unspecified: Secondary | ICD-10-CM | POA: Diagnosis not present

## 2023-07-29 DIAGNOSIS — R319 Hematuria, unspecified: Secondary | ICD-10-CM | POA: Diagnosis present

## 2023-07-29 DIAGNOSIS — N3001 Acute cystitis with hematuria: Secondary | ICD-10-CM | POA: Insufficient documentation

## 2023-07-29 DIAGNOSIS — N3000 Acute cystitis without hematuria: Secondary | ICD-10-CM

## 2023-07-29 LAB — URINALYSIS, ROUTINE W REFLEX MICROSCOPIC
RBC / HPF: >50 RBC/hpf (ref 0–5)
WBC, UA: >50 WBC/hpf (ref 0–5)

## 2023-07-29 MED ORDER — CEFDINIR 300 MG PO CAPS: 300.0000 mg | ORAL_CAPSULE | Freq: Two times a day (BID) | ORAL | 0 refills | Status: AC

## 2023-07-29 MED ORDER — CEFDINIR 300 MG PO CAPS
300.0000 mg | ORAL_CAPSULE | Freq: Two times a day (BID) | ORAL | Status: DC
Start: 2023-07-29 — End: 2023-07-30
  Administered 2023-07-29: 300 mg via ORAL
  Filled 2023-07-29 (×2): qty 1

## 2023-07-29 MED ORDER — OXYCODONE HCL 5 MG PO TABS
5.0000 mg | ORAL_TABLET | Freq: Once | ORAL | Status: AC
  Administered 2023-07-29: 5 mg via ORAL
  Filled 2023-07-29: qty 1

## 2023-07-29 MED ORDER — OXYCODONE HCL 5 MG PO TABS: 5.0000 mg | ORAL_TABLET | Freq: Four times a day (QID) | ORAL | 0 refills | Status: DC | PRN

## 2023-07-29 MED ORDER — ACETAMINOPHEN 500 MG PO TABS
1000.0000 mg | ORAL_TABLET | Freq: Once | ORAL | Status: AC
  Administered 2023-07-29: 1000 mg via ORAL
  Filled 2023-07-29: qty 2

## 2023-07-29 NOTE — Discharge Instructions (Addendum)
 Patient can use the pain medication every 6 hours 5 mg as needed as well as his previously prescribed Tylenol 500 mg every 6 hours as needed.  He is also been prescribed an antibiotic to start tomorrow night for 7 days.

## 2023-07-29 NOTE — ED Provider Notes (Signed)
 [place] EMERGENCY DEPARTMENT AT [facility] Provider Note   CSN: [id number] Arrival date & time: 07/29/23  1924     History Chief Complaint  Patient presents with   Hematuria    HPI Christopher Rivas is a 73 y.o. male presenting for chief complaint of hematuria States he is having significant pain on urination. Denies fevers chills nausea vomiting syncope shortness of breath.  Recently seen in the hospital for similar underwent catheterization and has had worsening bleeding ever since.  Patient's recorded medical, surgical, social, medication list and allergies were reviewed in the Snapshot window as part of the initial history.   Review of Systems   Review of Systems  Constitutional:  Negative for chills and fever.  HENT:  Negative for ear pain and sore throat.   Eyes:  Negative for pain and visual disturbance.  Respiratory:  Negative for cough and shortness of breath.   Cardiovascular:  Negative for chest pain and palpitations.  Gastrointestinal:  Negative for abdominal pain and vomiting.  Genitourinary:  Positive for dysuria. Negative for hematuria.  Musculoskeletal:  Negative for arthralgias and back pain.  Skin:  Negative for color change and rash.  Neurological:  Negative for seizures and syncope.  All other systems reviewed and are negative.   Physical Exam Updated Vital Signs BP 126/74   Pulse 93   Temp 98.6 F (37 C) (Oral)   Resp 18   SpO2 95%  Physical Exam Vitals and nursing note reviewed.  Constitutional:      General: He is not in acute distress.    Appearance: He is well-developed.  HENT:     Head: Normocephalic and atraumatic.  Eyes:     Conjunctiva/sclera: Conjunctivae normal.  Cardiovascular:     Rate and Rhythm: Normal rate and regular rhythm.     Heart sounds: No murmur heard. Pulmonary:     Effort: Pulmonary effort is normal. No respiratory distress.     Breath sounds: Normal breath sounds.  Abdominal:     Palpations: Abdomen is  soft.     Tenderness: There is no abdominal tenderness.  Musculoskeletal:        General: No swelling.     Cervical back: Neck supple.  Skin:    General: Skin is warm and dry.     Capillary Refill: Capillary refill takes less than 2 seconds.  Neurological:     Mental Status: He is alert.  Psychiatric:        Mood and Affect: Mood normal.      ED Course/ Medical Decision Making/ A&P    Procedures Procedures   Medications Ordered in ED Medications  cefdinir  (OMNICEF ) capsule 300 mg (300 mg Oral Given 07/29/23 2254)  acetaminophen  (TYLENOL ) tablet 1,000 mg (1,000 mg Oral Given 07/29/23 2251)  oxyCODONE  (Oxy IR/ROXICODONE ) immediate release tablet 5 mg (5 mg Oral Given 07/29/23 2250)    Medical Decision Making:   73 year old male presenting with gross hematuria. His history of present on his physical exam findings are most consistent with likely infection after his recent Foley catheterization versus other slight trauma.  Considered urinary retention and performed a point-of-care ultrasound of his bladder and kidneys.  No evidence of hydronephrosis bilaterally no visualized nephrolithiasis, postvoid residual only 14 mL. No free fluid in the pelvis. Given reassuring ultrasound and consistency of presentation with UTI, treated with Omnicef  and pain control and patient seems to be improving at this time.  Family is at bedside and feels comfortable with outpatient care  and management.  Disposition:  I have considered need for hospitalization, however, considering all of the above, I believe this patient is stable for discharge at this time.  Patient/family educated about specific return precautions for given chief complaint and symptoms.  Patient/family educated about follow-up with PCP.     Patient/family expressed understanding of return precautions and need for follow-up. Patient spoken to regarding all imaging and laboratory results and appropriate follow up for these results. All  education provided in verbal form with additional information in written form. Time was allowed for answering of patient questions. Patient discharged.    Emergency Department Medication Summary:   Medications  cefdinir  (OMNICEF ) capsule 300 mg (300 mg Oral Given 07/29/23 2254)  acetaminophen  (TYLENOL ) tablet 1,000 mg (1,000 mg Oral Given 07/29/23 2251)  oxyCODONE  (Oxy IR/ROXICODONE ) immediate release tablet 5 mg (5 mg Oral Given 07/29/23 2250)        Clinical Impression:  1. Hematuria, unspecified type   2. Acute cystitis without hematuria      Discharge   Final Clinical Impression(s) / ED Diagnoses Final diagnoses:  Hematuria, unspecified type  Acute cystitis without hematuria    Rx / DC Orders ED Discharge Orders          Ordered    cefdinir  (OMNICEF ) 300 MG capsule  2 times daily        07/29/23 2304    oxyCODONE  (ROXICODONE ) 5 MG immediate release tablet  Every 6 hours PRN        07/29/23 2323              Jerral Meth, MD 07/29/23 2327

## 2023-07-29 NOTE — ED Triage Notes (Signed)
 Pt BIB [facility] from [facility] facility for hematuria/dysuria x 1 day. Pt is alert and oriented x 4 but is legally blind. He states that he has pain in his penis when urinating and relates to recent in/out catheter placed while hospitalized.

## 2023-08-23 ENCOUNTER — Ambulatory Visit (INDEPENDENT_AMBULATORY_CARE_PROVIDER_SITE_OTHER): Payer: Medicare PPO | Admitting: Podiatry

## 2023-08-23 DIAGNOSIS — Z91199 Patient's noncompliance with other medical treatment and regimen due to unspecified reason: Secondary | ICD-10-CM

## 2023-08-23 NOTE — Progress Notes (Signed)
No show

## 2023-08-28 ENCOUNTER — Ambulatory Visit (INDEPENDENT_AMBULATORY_CARE_PROVIDER_SITE_OTHER): Payer: Medicare PPO | Admitting: Podiatry

## 2023-08-28 DIAGNOSIS — Z91199 Patient's noncompliance with other medical treatment and regimen due to unspecified reason: Secondary | ICD-10-CM

## 2023-08-28 NOTE — Progress Notes (Signed)
 No show

## 2023-09-02 IMAGING — CT CT HEAD W/O CM
3 of 4 series · 14 of 47 positions shown, 16 images · non-contrast
Comparison: None

CLINICAL DATA: A 70-year-old male presents for evaluation of head
trauma.

EXAM:
CT HEAD WITHOUT CONTRAST
TECHNIQUE: Contiguous axial images were obtained from the base of the skull
through the vertex without intravenous contrast.

[Series 4: head 2.0 h70h · axial · 0.47mm/px · z∈[-108,+20]mm · 8 of 80 slices shown, 10 images]
[im 8/80  brain]
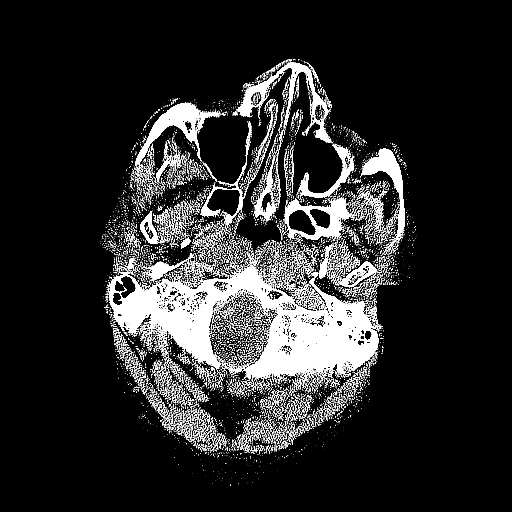
[im 8/80  bone]
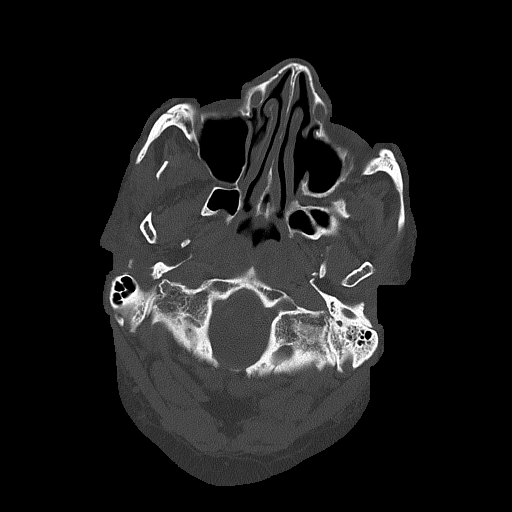
[im 16/80  brain]
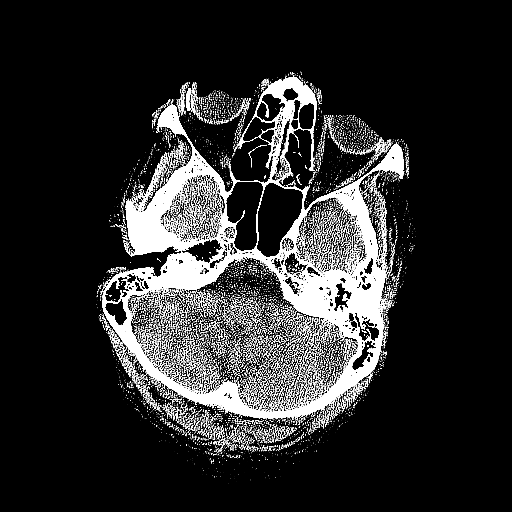
[im 24/80  brain]
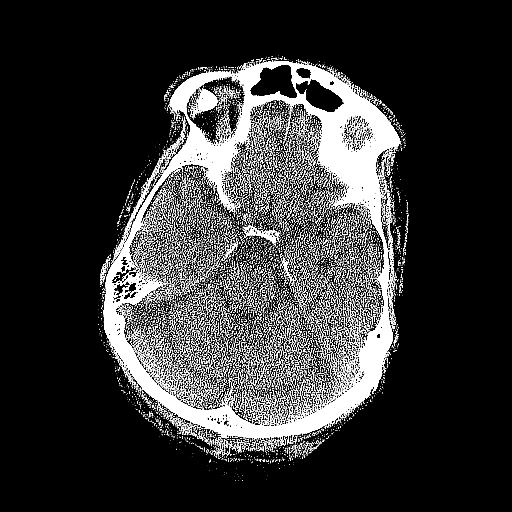
[im 36/80  brain]
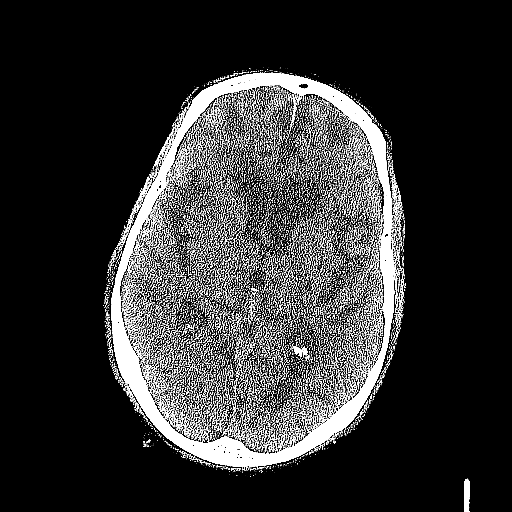
[im 44/80  brain]
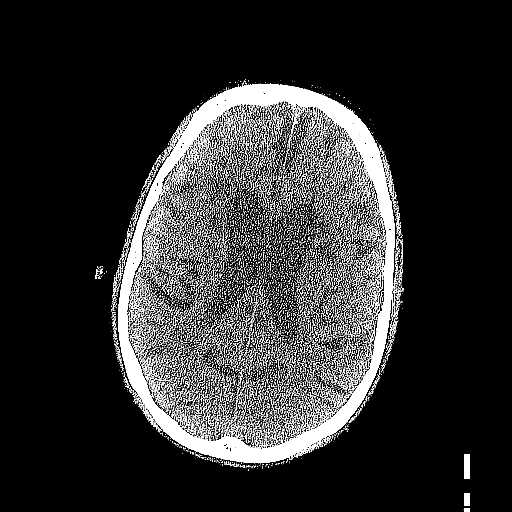
[im 44/80  bone]
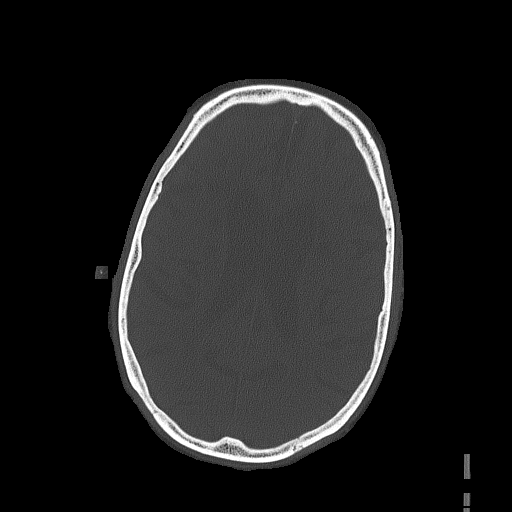
[im 56/80  brain]
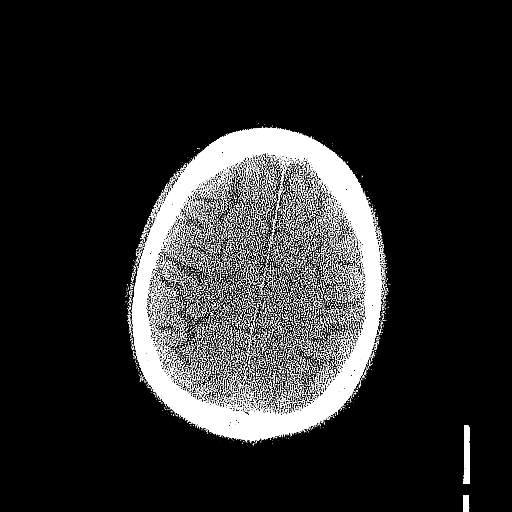
[im 64/80  brain]
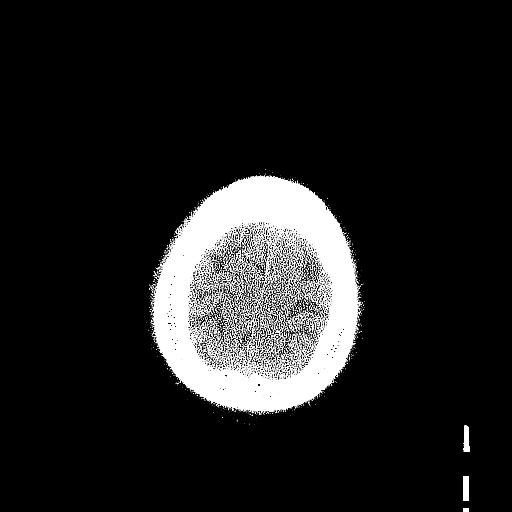
[im 72/80  brain]
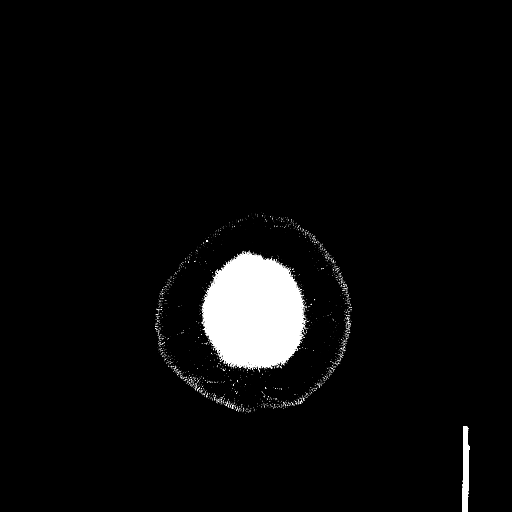

[Series 5: head 3.0 mpr cor · coronal · 0.31mm/px · 3 of 73 slices shown]
[im 25/73  brain]
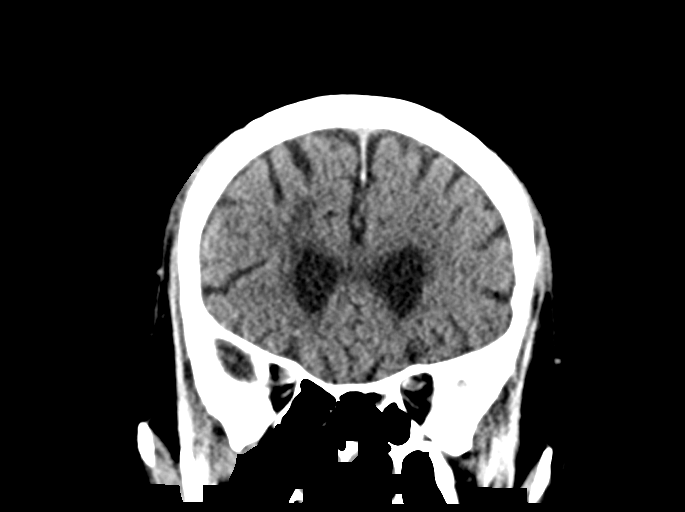
[im 33/73  brain]
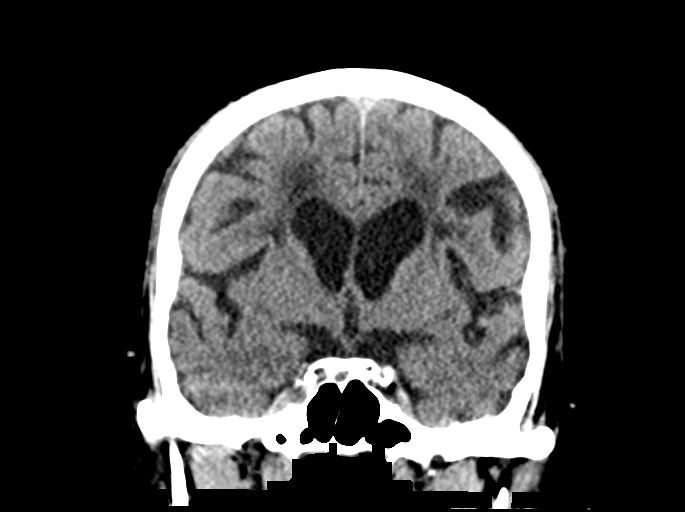
[im 41/73  brain]
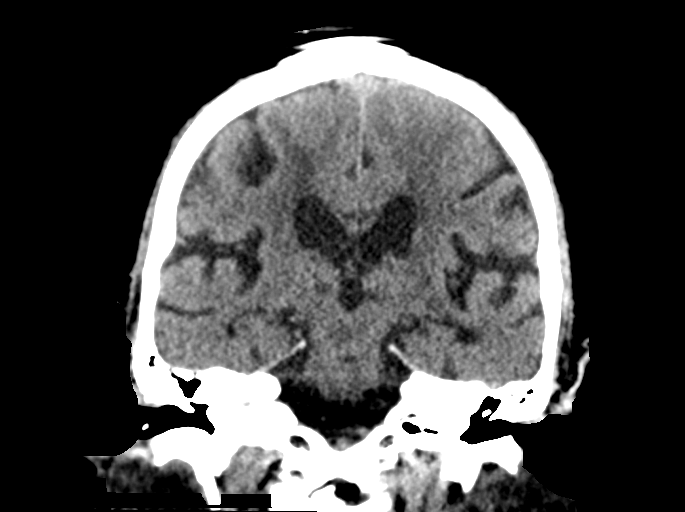

[Series 6: head 3.0 mpr sag · sagittal · 0.31mm/px · 3 of 67 slices shown]
[im 23/67  brain]
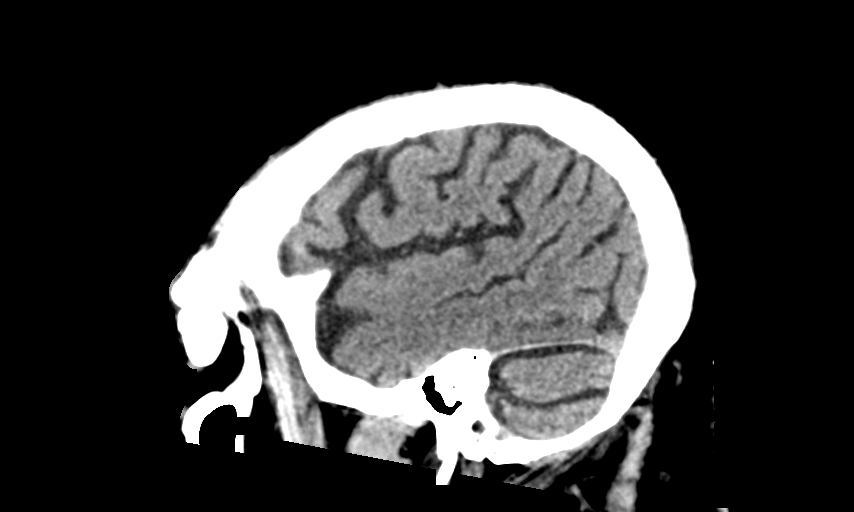
[im 34/67  brain]
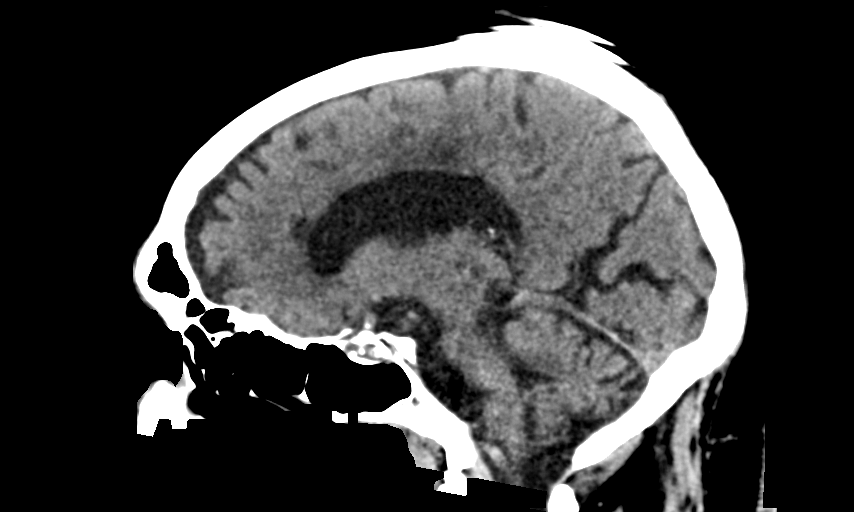
[im 45/67  brain]
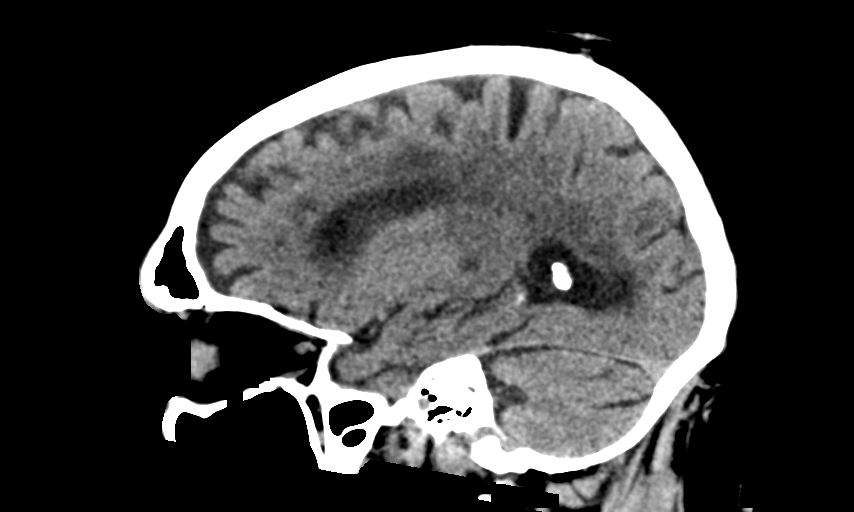

[14 of 47 positions shown; findings below may reference images not displayed]

FINDINGS: Brain: Extensive white matter disease and scattered areas of prior
infarct. No priors for comparison. No acute intracranial hemorrhage
or midline shift. Signs of prior RIGHT cerebellar infarct in
addition to multifocal areas of presumed lacunar infarction in
bilateral deep white matter and basal ganglia.

Vascular: No hyperdense vessel or unexpected calcification.

Skull: Normal. Negative for fracture or focal lesion.

Sinuses/Orbits: Visualized paranasal sinuses and orbits without
acute process. Postprocedural changes in the orbits may relate to
scleral buckling or similar procedure. Correlate with surgical
history.

Other: None
IMPRESSION: No acute intracranial hemorrhage

Extensive white matter disease and scattered areas of prior infarct.
In the absence of priors some of these areas are age indeterminate.
Some are clearly water density and chronic. If there is high
clinical suspicion for infarct MRI may be helpful for further
evaluation as warranted.

Postprocedural changes in the orbits may relate to scleral buckling
or similar procedure. Correlate with surgical history.

## 2023-09-02 IMAGING — DX DG CHEST 1V PORT
2 series · 2 of 2 positions shown · non-contrast
Comparison: 10/11/2018

CLINICAL DATA: Loss of consciousness

EXAM:
PORTABLE CHEST 1 VIEW

[chest ap (1 of 2)]
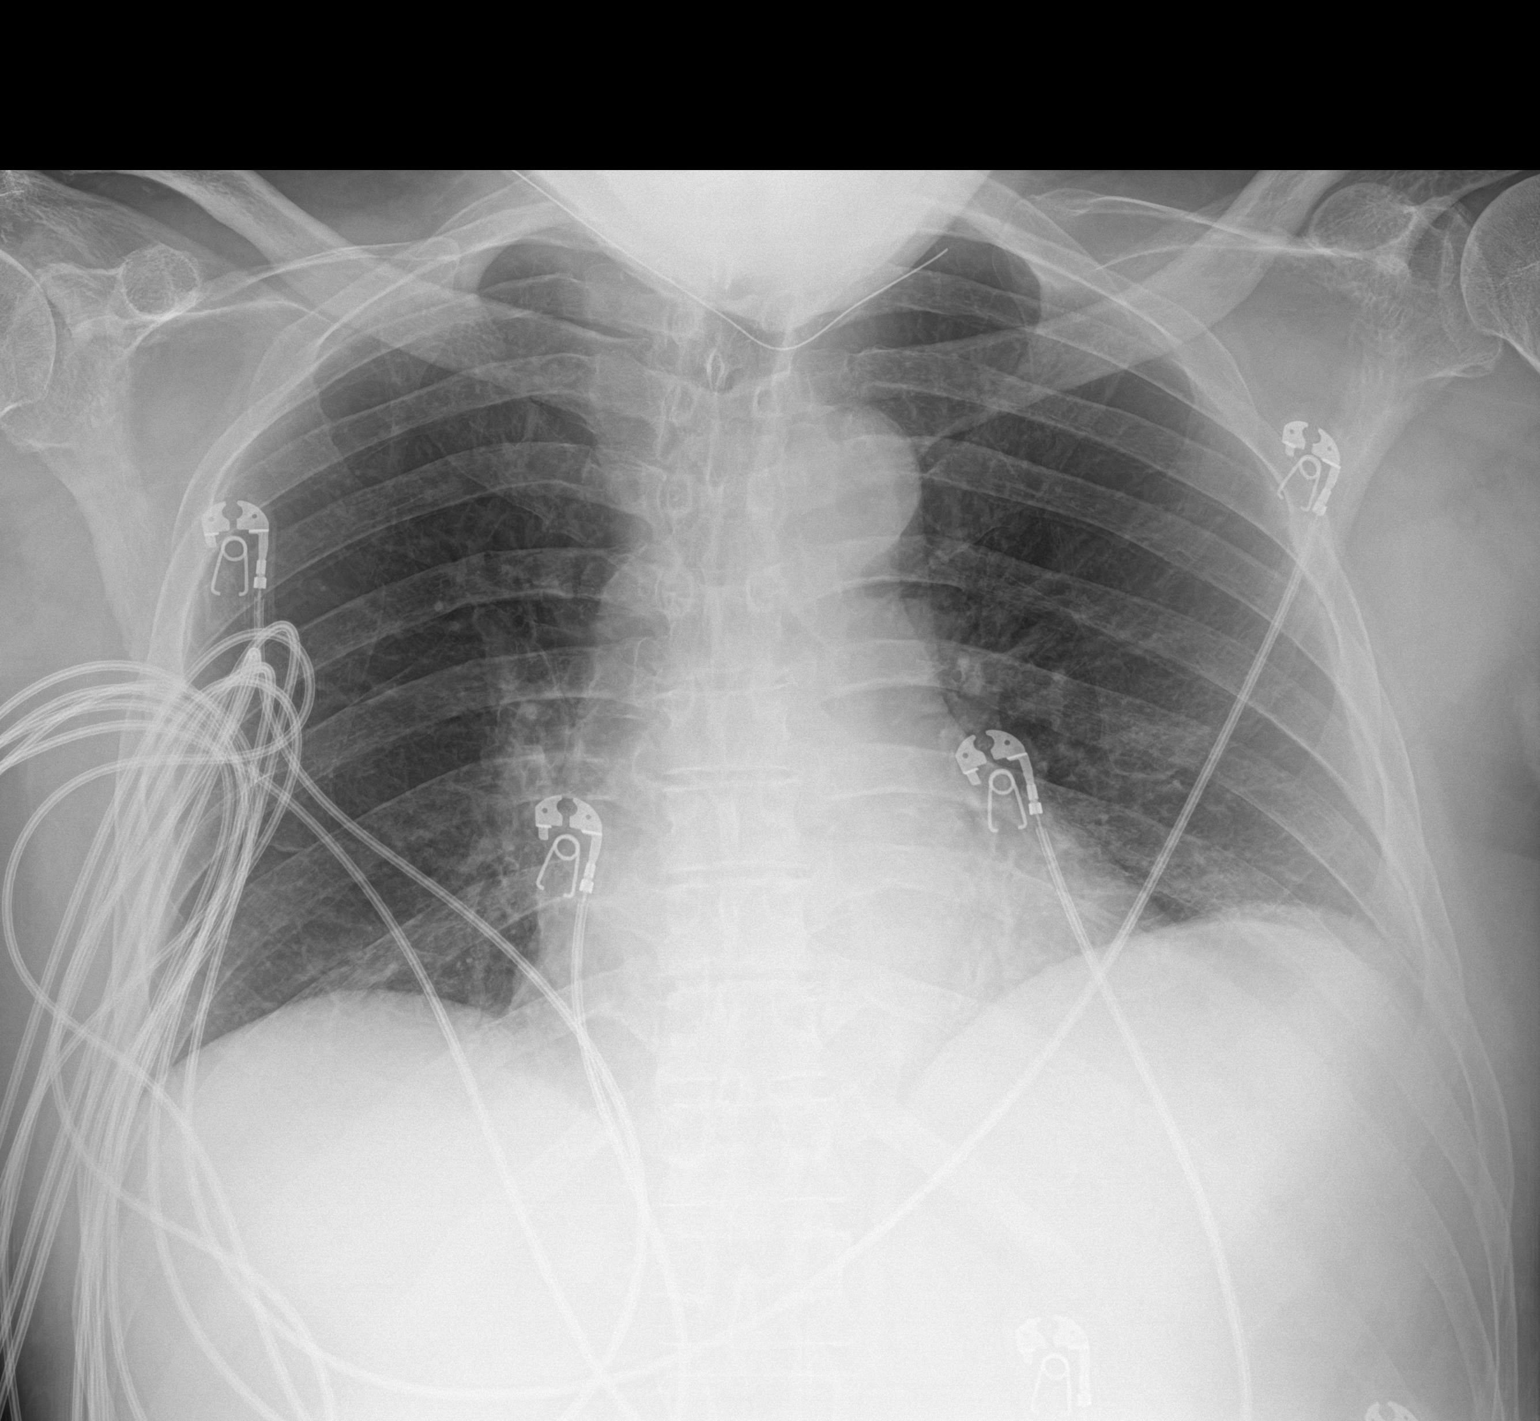

[chest ap (2 of 2)]
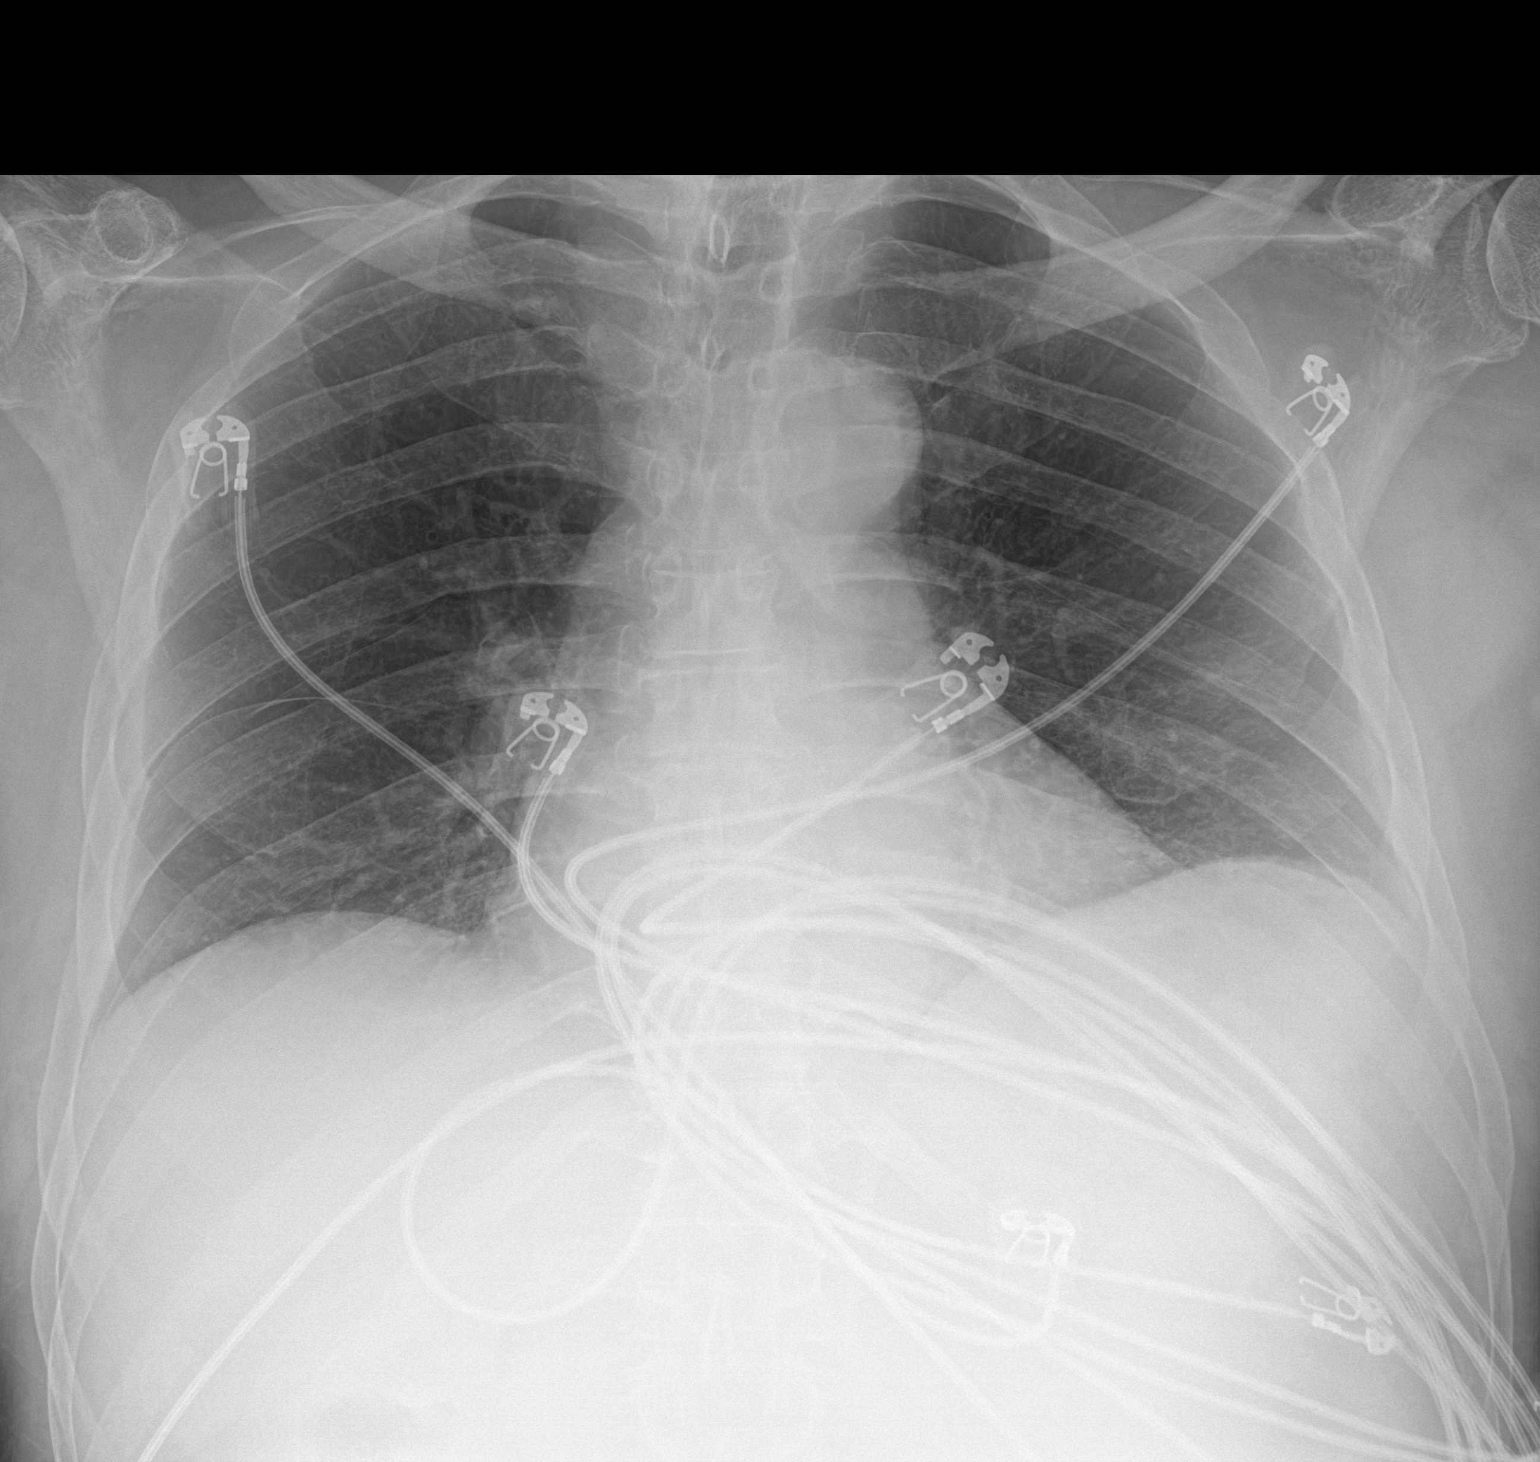

[2 of 2 positions shown; findings below may reference images not displayed]

FINDINGS: The heart size and mediastinal contours are within normal limits.
Unchanged elevation of the left hemidiaphragm. No acute airspace
opacity. The visualized skeletal structures are unremarkable.
IMPRESSION: No acute abnormality of the lungs in AP portable projection.
Unchanged elevation of the left hemidiaphragm.

## 2023-09-06 ENCOUNTER — Ambulatory Visit: Payer: Medicare PPO | Admitting: Podiatry

## 2023-10-25 ENCOUNTER — Encounter: Payer: Self-pay | Admitting: Podiatry

## 2023-10-25 ENCOUNTER — Ambulatory Visit (INDEPENDENT_AMBULATORY_CARE_PROVIDER_SITE_OTHER): Admitting: Podiatry

## 2023-10-25 DIAGNOSIS — M79674 Pain in right toe(s): Secondary | ICD-10-CM | POA: Diagnosis not present

## 2023-10-25 DIAGNOSIS — M79675 Pain in left toe(s): Secondary | ICD-10-CM

## 2023-10-25 DIAGNOSIS — E1149 Type 2 diabetes mellitus with other diabetic neurological complication: Secondary | ICD-10-CM | POA: Diagnosis not present

## 2023-10-25 DIAGNOSIS — B351 Tinea unguium: Secondary | ICD-10-CM | POA: Diagnosis not present

## 2023-10-25 NOTE — Progress Notes (Signed)
  Subjective:  Patient ID: Christopher Rivas, male    DOB: Apr 08, 1951,   MRN: [id number]  No chief complaint on file.   73 y.o. male presents for concern of thickened elongated and painful nails that are difficult to trim. Requesting to have them trimmed today. Relates burning and tingling in their feet. Patient is diabetic and last A1c was No results found for: "HGBA1C" .   PCP:  [facility]    . Denies any other pedal complaints. Denies n/v/f/c.   Past Medical History:  Diagnosis Date   Arthritis    Diabetes mellitus    Glaucoma    Hypercholesterolemia    Hypertension    Prostate cancer (HCC)     Objective:  Physical Exam: Vascular: DP/PT pulses 1/4 bilateral. CFT <3 seconds. Absent hair growth on digits. Edema noted to bilateral lower extremities. Xerosis noted bilaterally.  Skin. No lacerations or abrasions bilateral feet. Nails 1-5 bilateral  are thickened discolored and elongated with subungual debris.  Musculoskeletal: MMT 5/5 bilateral lower extremities in DF, PF, Inversion and Eversion. Deceased ROM in DF of ankle joint.  Neurological: Sensation intact to light touch. Protective sensation diminished bilateral.    Assessment:   1. Pain due to onychomycosis of toenails of both feet   2. Type 2 diabetes mellitus with other neurologic complication, without long-term current use of insulin (HCC)      Plan:  Patient was evaluated and treated and all questions answered. -Discussed and educated patient on diabetic foot care, especially with  regards to the vascular, neurological and musculoskeletal systems.  -Stressed the importance of good glycemic control and the detriment of not  controlling glucose levels in relation to the foot. -Discussed supportive shoes at all times and checking feet regularly.  -Mechanically debrided all nails 1-5 bilateral using sterile nail nipper and filed with dremel without incident  -Pulses diminished and discoloration. ABIs ordered.   -Answered all patient questions -Patient to return  in 3 months for at risk foot care -Patient advised to call the office if any problems or questions arise in the meantime.   Louann Sjogren, DPM

## 2023-11-23 ENCOUNTER — Telehealth (HOSPITAL_COMMUNITY): Payer: Self-pay

## 2023-11-23 NOTE — Telephone Encounter (Signed)
 Attempted to contact the patient to schedule VAS [place].  No answer.  Left message.  Second Attempt. Provided direct contact number for scheduling: [phone].

## 2023-12-10 ENCOUNTER — Emergency Department (HOSPITAL_COMMUNITY)

## 2023-12-10 ENCOUNTER — Other Ambulatory Visit: Payer: Self-pay

## 2023-12-10 ENCOUNTER — Observation Stay (HOSPITAL_COMMUNITY)

## 2023-12-10 ENCOUNTER — Encounter (HOSPITAL_COMMUNITY): Payer: Self-pay | Admitting: *Deleted

## 2023-12-10 ENCOUNTER — Observation Stay (HOSPITAL_COMMUNITY)
Admission: EM | Admit: 2023-12-10 | Discharge: 2023-12-13 | Disposition: A | Discharge status: Skilled Nursing Facility | Attending: Family Medicine | Admitting: Family Medicine

## 2023-12-10 DIAGNOSIS — R55 Syncope and collapse: Principal | ICD-10-CM

## 2023-12-10 DIAGNOSIS — H4010X0 Unspecified open-angle glaucoma, stage unspecified: Secondary | ICD-10-CM | POA: Insufficient documentation

## 2023-12-10 DIAGNOSIS — M6281 Muscle weakness (generalized): Secondary | ICD-10-CM | POA: Insufficient documentation

## 2023-12-10 DIAGNOSIS — E1149 Type 2 diabetes mellitus with other diabetic neurological complication: Secondary | ICD-10-CM | POA: Diagnosis not present

## 2023-12-10 DIAGNOSIS — D649 Anemia, unspecified: Secondary | ICD-10-CM | POA: Diagnosis not present

## 2023-12-10 DIAGNOSIS — Z794 Long term (current) use of insulin: Secondary | ICD-10-CM | POA: Insufficient documentation

## 2023-12-10 DIAGNOSIS — I159 Secondary hypertension, unspecified: Secondary | ICD-10-CM

## 2023-12-10 DIAGNOSIS — E785 Hyperlipidemia, unspecified: Secondary | ICD-10-CM | POA: Diagnosis present

## 2023-12-10 DIAGNOSIS — E11311 Type 2 diabetes mellitus with unspecified diabetic retinopathy with macular edema: Secondary | ICD-10-CM | POA: Diagnosis not present

## 2023-12-10 DIAGNOSIS — I43 Cardiomyopathy in diseases classified elsewhere: Secondary | ICD-10-CM | POA: Diagnosis not present

## 2023-12-10 DIAGNOSIS — I10 Essential (primary) hypertension: Secondary | ICD-10-CM | POA: Diagnosis present

## 2023-12-10 DIAGNOSIS — C61 Malignant neoplasm of prostate: Secondary | ICD-10-CM | POA: Diagnosis present

## 2023-12-10 DIAGNOSIS — I119 Hypertensive heart disease without heart failure: Secondary | ICD-10-CM | POA: Insufficient documentation

## 2023-12-10 DIAGNOSIS — E119 Type 2 diabetes mellitus without complications: Secondary | ICD-10-CM

## 2023-12-10 DIAGNOSIS — Z8546 Personal history of malignant neoplasm of prostate: Secondary | ICD-10-CM | POA: Diagnosis not present

## 2023-12-10 DIAGNOSIS — Z7982 Long term (current) use of aspirin: Secondary | ICD-10-CM | POA: Insufficient documentation

## 2023-12-10 DIAGNOSIS — Z7984 Long term (current) use of oral hypoglycemic drugs: Secondary | ICD-10-CM | POA: Insufficient documentation

## 2023-12-10 DIAGNOSIS — I429 Cardiomyopathy, unspecified: Secondary | ICD-10-CM

## 2023-12-10 DIAGNOSIS — Z79899 Other long term (current) drug therapy: Secondary | ICD-10-CM | POA: Diagnosis not present

## 2023-12-10 DIAGNOSIS — H401190 Primary open-angle glaucoma, unspecified eye, stage unspecified: Secondary | ICD-10-CM | POA: Insufficient documentation

## 2023-12-10 DIAGNOSIS — I5189 Other ill-defined heart diseases: Secondary | ICD-10-CM

## 2023-12-10 HISTORY — DX: Unspecified visual loss: H54.7

## 2023-12-10 LAB — CBC WITH DIFFERENTIAL/PLATELET
Abs Immature Granulocytes: 0.03 10*3/uL (ref 0.00–0.07)
Basophils Absolute: 0 10*3/uL (ref 0.0–0.1)
Basophils Relative: 0 %
Eosinophils Absolute: 0.1 10*3/uL (ref 0.0–0.5)
Eosinophils Relative: 2 %
HCT: 37.1 % — ABNORMAL LOW (ref 39.0–52.0)
Hemoglobin: 12.9 g/dL — ABNORMAL LOW (ref 13.0–17.0)
Immature Granulocytes: 1 %
Lymphocytes Relative: 23 %
Lymphs Abs: 1.3 10*3/uL (ref 0.7–4.0)
MCH: 27 pg (ref 26.0–34.0)
MCHC: 34.8 g/dL (ref 30.0–36.0)
MCV: 77.8 fL — ABNORMAL LOW (ref 80.0–100.0)
Monocytes Absolute: 0.6 10*3/uL (ref 0.1–1.0)
Monocytes Relative: 10 %
Neutro Abs: 3.5 10*3/uL (ref 1.7–7.7)
Neutrophils Relative %: 64 %
Platelets: 202 10*3/uL (ref 150–400)
RBC: 4.77 MIL/uL (ref 4.22–5.81)
RDW: 15.9 % — ABNORMAL HIGH (ref 11.5–15.5)
WBC: 5.5 10*3/uL (ref 4.0–10.5)
nRBC: 0 % (ref 0.0–0.2)

## 2023-12-10 LAB — BASIC METABOLIC PANEL WITH GFR
Anion gap: 7 (ref 5–15)
BUN: 17 mg/dL (ref 8–23)
CO2: 23 mmol/L (ref 22–32)
Calcium: 8.8 mg/dL — ABNORMAL LOW (ref 8.9–10.3)
Chloride: 107 mmol/L (ref 98–111)
Creatinine, Ser: 1.18 mg/dL (ref 0.61–1.24)
GFR, Estimated: >60 mL/min (ref >60)
Glucose, Bld: 260 mg/dL — ABNORMAL HIGH (ref 70–99)
Potassium: 4.3 mmol/L (ref 3.5–5.1)
Sodium: 137 mmol/L (ref 135–145)

## 2023-12-10 LAB — BRAIN NATRIURETIC PEPTIDE: B Natriuretic Peptide: 17 pg/mL (ref 0.0–100.0)

## 2023-12-10 LAB — D-DIMER, QUANTITATIVE: D-Dimer, Quant: 0.47 ug{FEU}/mL (ref 0.00–0.50)

## 2023-12-10 LAB — CBG MONITORING, ED: Glucose-Capillary: 227 mg/dL — ABNORMAL HIGH (ref 70–99)

## 2023-12-10 LAB — TROPONIN I (HIGH SENSITIVITY): Troponin I (High Sensitivity): 9 ng/L (ref <18)

## 2023-12-10 MED ORDER — DORZOLAMIDE HCL 2 % OP SOLN
1.0000 [drp] | Freq: Two times a day (BID) | OPHTHALMIC | Status: DC
Start: 2023-12-10 — End: 2023-12-14
  Administered 2023-12-11 – 2023-12-13 (×6): 1 [drp] via OPHTHALMIC
  Filled 2023-12-10: qty 10

## 2023-12-10 MED ORDER — BRIMONIDINE TARTRATE 0.2 % OP SOLN
1.0000 [drp] | Freq: Every day | OPHTHALMIC | Status: DC
  Administered 2023-12-11 – 2023-12-13 (×3): 1 [drp] via OPHTHALMIC
  Filled 2023-12-10: qty 5

## 2023-12-10 MED ORDER — PRAVASTATIN SODIUM 10 MG PO TABS
20.0000 mg | ORAL_TABLET | Freq: Every day | ORAL | Status: DC
  Administered 2023-12-11 – 2023-12-13 (×3): 20 mg via ORAL
  Filled 2023-12-10 (×3): qty 2

## 2023-12-10 MED ORDER — ENOXAPARIN SODIUM 40 MG/0.4ML IJ SOSY
40.0000 mg | PREFILLED_SYRINGE | INTRAMUSCULAR | Status: DC
  Administered 2023-12-11 – 2023-12-13 (×3): 40 mg via SUBCUTANEOUS
  Filled 2023-12-10 (×3): qty 0.4

## 2023-12-10 MED ORDER — INSULIN GLARGINE-YFGN 100 UNIT/ML ~~LOC~~ SOLN
7.0000 [IU] | Freq: Every day | SUBCUTANEOUS | Status: DC
  Administered 2023-12-11: 7 [IU] via SUBCUTANEOUS
  Filled 2023-12-10 (×2): qty 0.07

## 2023-12-10 MED ORDER — ACETAMINOPHEN 325 MG PO TABS: 650.0000 mg | ORAL_TABLET | Freq: Four times a day (QID) | ORAL | Status: DC | PRN

## 2023-12-10 MED ORDER — HYDRALAZINE HCL 20 MG/ML IJ SOLN: 5.0000 mg | INTRAMUSCULAR | Status: DC | PRN

## 2023-12-10 MED ORDER — TIMOLOL MALEATE 0.5 % OP SOLN
1.0000 [drp] | Freq: Every day | OPHTHALMIC | Status: DC
  Administered 2023-12-11 – 2023-12-13 (×3): 1 [drp] via OPHTHALMIC
  Filled 2023-12-10: qty 5

## 2023-12-10 MED ORDER — ACETAMINOPHEN 650 MG RE SUPP: 650.0000 mg | Freq: Four times a day (QID) | RECTAL | Status: DC | PRN

## 2023-12-10 MED ORDER — LACTATED RINGERS IV SOLN: INTRAVENOUS | Status: AC

## 2023-12-10 MED ORDER — ASPIRIN 81 MG PO TBEC
81.0000 mg | DELAYED_RELEASE_TABLET | Freq: Every day | ORAL | Status: DC
  Administered 2023-12-11 – 2023-12-13 (×3): 81 mg via ORAL
  Filled 2023-12-10 (×3): qty 1

## 2023-12-10 MED ORDER — BRIMONIDINE TARTRATE-TIMOLOL 0.2-0.5 % OP SOLN: 1.0000 [drp] | Freq: Every morning | OPHTHALMIC | Status: DC

## 2023-12-10 MED ORDER — INSULIN ASPART 100 UNIT/ML IJ SOLN
0.0000 [IU] | Freq: Three times a day (TID) | INTRAMUSCULAR | Status: DC
  Administered 2023-12-11: 2 [IU] via SUBCUTANEOUS
  Administered 2023-12-11 – 2023-12-12 (×2): 1 [IU] via SUBCUTANEOUS
  Administered 2023-12-12 – 2023-12-13 (×2): 5 [IU] via SUBCUTANEOUS
  Administered 2023-12-13: 2 [IU] via SUBCUTANEOUS

## 2023-12-10 NOTE — H&P (Addendum)
 History and Physical    Christopher Rivas GNF:[id number] DOB: 1950/10/16 DOA: 12/10/2023  Patient coming from: Independent living facility.  Chief Complaint: Loss of consciousness.  History obtained from patient's sister Christopher Rivas.  Can be reached at [phone].  HPI: Christopher Rivas is a 73 y.o. male with history of diabetes mellitus type 2, hypertension, hyperlipidemia, chronic anemia was brought to the ER after patient had a syncopal episode.  As per the patient's sister patient and the family were having a family get together having lunch when patient suddenly started becoming lethargic confused and lost consciousness.  He was not responding to the questions well but soon became awake appeared confused.  Did not have any incontinence of urine or bowel.  Patient was holding onto his stomach when this episode happened.  The whole episode lasted around twenty minutes.  EMS on arrival found patient's blood pressure was 90 systolic with CBG of 286.  Patient was given 500 cc fluid bolus and brought to the ER.    Per patient's family patient has had similar episode at least 3 times in the last few months.  Patient was recently started on insulin for his diabetes melitis at his living facility last month.  ED Course: In the ER patient was back to baseline.  Moving all extremities without any difficulty.  EKG shows normal sinus rhythm with QTc of 404 ms.  Troponins were negative BNP was unremarkable hemoglobin 12.9 creatinine 1.1 blood sugar 260.  CT head did not show anything acute.  CT abdomen was unremarkable.  Review of Systems: As per HPI, rest all negative.   Past Medical History:  Diagnosis Date   Arthritis    Diabetes mellitus    Glaucoma    Hypercholesterolemia    Hypertension    Prostate cancer (HCC)    Vision loss     Past Surgical History:  Procedure Laterality Date   cataract surgery     EYE SURGERY     PROSTATE BIOPSY       reports that he has never smoked. He has  never used smokeless tobacco. He reports that he does not drink alcohol and does not use drugs.  No Known Allergies  Family History  Problem Relation Age of Onset   Cancer Brother        prostate   Cancer Brother        prostate    Prior to Admission medications   Medication Sig Start Date End Date Taking? Authorizing Provider  amLODipine (NORVASC) 10 MG tablet Take 10 mg by mouth daily.    [provider]  aspirin EC 81 MG tablet Take 81 mg by mouth daily. Swallow whole.    [provider]  brimonidine-timolol (COMBIGAN) 0.2-0.5 % ophthalmic solution Place 1 drop into both eyes every morning. 06/27/19   [provider]  dorzolamide (TRUSOPT) 2 % ophthalmic solution Place 1 drop into both eyes 2 (two) times daily. 11/11/20   [provider]  gabapentin (NEURONTIN) 100 MG capsule Take 200 mg by mouth in the morning. 06/18/23   [provider]  glimepiride (AMARYL) 2 MG tablet Take 2 mg by mouth daily with breakfast.    [provider]  lisinopril (ZESTRIL) 10 MG tablet Take 10 mg by mouth daily. 07/10/13   [provider]  lovastatin (MEVACOR) 20 MG tablet Take 20 mg by mouth in the morning.    [provider]  oxyCODONE  (ROXICODONE ) 5 MG immediate release tablet Take 1  tablet (5 mg total) by mouth every 6 (six) hours as needed for severe pain (pain score 7-10). 07/29/23   Countryman, Chase, MD  sitaGLIPtin-metformin (JANUMET) 50-1000 MG tablet Take 1 tablet by mouth every morning. 08/04/14   [provider]  vitamin B-12 (CYANOCOBALAMIN) 500 MCG tablet Take 500 mcg by mouth daily.    [provider]    Physical Exam: Constitutional: Moderately built and nourished. Vitals:   12/10/23 1815 12/10/23 1830 12/10/23 1845 12/10/23 1900  BP: (!) 153/73 135/75 131/79 131/70  Pulse: 76 77 76 76  Resp: 11 12 15 13   Temp:      TempSrc:      SpO2: 100% 98% 99% 98%  Weight:      Height:       Eyes:  Anicteric no pallor. ENMT: No discharge from the ears eyes nose and mouth. Neck: No mass felt.  No neck rigidity. Respiratory: No rhonchi or crepitations. Cardiovascular: S1-S2 heard. Abdomen: Soft nontender bowel sound present. Musculoskeletal: No edema. Skin: No rash. Neurologic: Alert awake oriented to time place and person.  Moves all extremities. Psychiatric: Appears normal.  Normal affect.   Labs on Admission: I have personally reviewed following labs and imaging studies  CBC: Recent Labs  Lab 12/10/23 1626  WBC 5.5  NEUTROABS 3.5  HGB 12.9*  HCT 37.1*  MCV 77.8*  PLT 202   Basic Metabolic Panel: Recent Labs  Lab 12/10/23 1626  NA 137  K 4.3  CL 107  CO2 23  GLUCOSE 260*  BUN 17  CREATININE 1.18  CALCIUM 8.8*   GFR: Estimated Creatinine Clearance: 53.1 mL/min (by C-G formula based on SCr of 1.18 mg/dL). Liver Function Tests: No results for input(s): "AST", "ALT", "ALKPHOS", "BILITOT", "PROT", "ALBUMIN" in the last 168 hours. No results for input(s): "LIPASE", "AMYLASE" in the last 168 hours. No results for input(s): "AMMONIA" in the last 168 hours. Coagulation Profile: No results for input(s): "INR", "PROTIME" in the last 168 hours. Cardiac Enzymes: No results for input(s): "CKTOTAL", "CKMB", "CKMBINDEX", "TROPONINI" in the last 168 hours. BNP (last 3 results) No results for input(s): "PROBNP" in the last 8760 hours. HbA1C: No results for input(s): "HGBA1C" in the last 72 hours. CBG: Recent Labs  Lab 12/10/23 1625  GLUCAP 227*   Lipid Profile: No results for input(s): "CHOL", "HDL", "LDLCALC", "TRIG", "CHOLHDL", "LDLDIRECT" in the last 72 hours. Thyroid Function Tests: No results for input(s): "TSH", "T4TOTAL", "FREET4", "T3FREE", "THYROIDAB" in the last 72 hours. Anemia Panel: No results for input(s): "VITAMINB12", "FOLATE", "FERRITIN", "TIBC", "IRON", "RETICCTPCT" in the last 72 hours. Urine analysis:    Component Value Date/Time   COLORURINE  RED (A) 07/29/2023 2032   APPEARANCEUR TURBID (A) 07/29/2023 2032   LABSPEC  07/29/2023 2032    TEST NOT REPORTED DUE TO COLOR INTERFERENCE OF URINE PIGMENT   PHURINE  07/29/2023 2032    TEST NOT REPORTED DUE TO COLOR INTERFERENCE OF URINE PIGMENT   GLUCOSEU (A) 07/29/2023 2032    TEST NOT REPORTED DUE TO COLOR INTERFERENCE OF URINE PIGMENT   HGBUR (A) 07/29/2023 2032    TEST NOT REPORTED DUE TO COLOR INTERFERENCE OF URINE PIGMENT   BILIRUBINUR (A) 07/29/2023 2032    TEST NOT REPORTED DUE TO COLOR INTERFERENCE OF URINE PIGMENT   KETONESUR (A) 07/29/2023 2032    TEST NOT REPORTED DUE TO COLOR INTERFERENCE OF URINE PIGMENT   PROTEINUR (A) 07/29/2023 2032    TEST NOT REPORTED DUE TO COLOR INTERFERENCE OF URINE PIGMENT  UROBILINOGEN 1.0 07/09/2011 2240   NITRITE (A) 07/29/2023 2032    TEST NOT REPORTED DUE TO COLOR INTERFERENCE OF URINE PIGMENT   LEUKOCYTESUR (A) 07/29/2023 2032    TEST NOT REPORTED DUE TO COLOR INTERFERENCE OF URINE PIGMENT   Sepsis Labs: @LABRCNTIP (procalcitonin:4,lacticidven:4) )No results found for this or any previous visit (from the past 240 hours).   Radiological Exams on Admission: CT HEAD WO CONTRAST ( ) Result Date: 12/10/2023 CLINICAL DATA:  Memory loss EXAM: CT HEAD WITHOUT CONTRAST TECHNIQUE: Contiguous axial images were obtained from the base of the skull through the vertex without intravenous contrast. RADIATION DOSE REDUCTION: This exam was performed according to the departmental dose-optimization program which includes automated exposure control, adjustment of the mA and/or kV according to patient size and/or use of iterative reconstruction technique. COMPARISON:  CT head 07/23/2023. 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 Remote right PICA territory infarct. No evidence of new/interval acute large vascular territory infarct, acute hemorrhage, mass lesion or midline shift. No  hydrocephalus. Vascular: No hyperdense vessel.  Calcific atherosclerosis. Skull: No acute fracture. Sinuses/Orbits: Clear sinuses.  No acute orbital findings. Other: No mastoid effusions. IMPRESSION: 1. No evidence of acute intracranial abnormality. 2. Multiple remote infarcts. Electronically Signed   By: Stevenson Elbe M.D.   On: 12/10/2023 21:27   DG Chest Port 1 View Result Date: 12/10/2023 CLINICAL DATA:  syncope EXAM: PORTABLE CHEST 1 VIEW COMPARISON:  July 23, 2023 FINDINGS: The cardiomediastinal silhouette is unchanged in contour. No pleural effusion. No pneumothorax. No acute pleuroparenchymal abnormality. IMPRESSION: No acute cardiopulmonary abnormality. Electronically Signed   By: Clancy Crimes M.D.   On: 12/10/2023 17:31    EKG: Independently reviewed.  Normal sinus rhythm QTc of 404 ms.  Assessment/Plan Principal Problem:   Syncope Active Problems:   Prostate cancer (HCC)   DM type 2 (diabetes mellitus, type 2) (HCC)   HTN (hypertension)   Hyperlipidemia   OAG (open angle glaucoma)   Anemia    Syncope -    cause not clear.  Has had similar episodes at least 3 times in the last few months as per the family.  Will monitor in telemetry.  Check echocardiogram EEG MRI brain.  Patient likely may need event monitor.  Check orthostatics. Hypertension since at presentation was hypotensive holding amlodipine and lisinopril.  Will keep patient on as needed IV hydralazine and follow blood pressure trends. Diabetes mellitus type 2 with hyperglycemia and as per the family patient was recently started on insulin last month.  Dose not known will need to verify home medication.  Patient also takes oral hypoglycemics.  For now I placed patient on insulin glargine 7 units at bedtime along with sliding scale coverage. Hyperlipidemia on statins. Anemia appears to be chronic check anemia panel. Prior history of prostate cancer.  LFTs and UA are pending.  Since patient has syncope cause  not clear will need close monitoring further workup and more than 2 midnight stay.  Home medication will need to be verified.   Patient's sister Ms. Christopher Rivas can be reached at [phone] and patient's brother Christopher Rivas can be reached at [phone].   DVT prophylaxis: Lovenox. Code Status: Full code. Family Communication: Patient's sister. Disposition Plan: Monitored bed. Consults called: None. Admission status: Observation.

## 2023-12-10 NOTE — ED Notes (Signed)
 CCMD contacted

## 2023-12-10 NOTE — ED Notes (Signed)
Provider is bedside

## 2023-12-10 NOTE — ED Notes (Signed)
 Per Dr [facility] Lavender Pt does not need to be monitored or IV restarted until he gets a bed upstairs.

## 2023-12-10 NOTE — ED Notes (Signed)
 Pt has removed his IV and all monitoring equipment, states he doesn't need any of this. Provider made aware.

## 2023-12-10 NOTE — ED Notes (Signed)
 Siblings Christopher Rivas [phone], Christopher Rivas [phone] would like an update asap

## 2023-12-10 NOTE — ED Notes (Signed)
 Charge RN called floor, spoke to accepting RN.  Pt cannot wait until MRI is done.  RN accepts pt for transfer up to floor.

## 2023-12-10 NOTE — ED Provider Notes (Signed)
 [facility] AT [facility] Provider Note   CSN: [id number] Arrival date & time: 12/10/23  1613     History  Chief Complaint  Patient presents with   Loss of Consciousness    Christopher Rivas is a 73 y.o. male.  HPI   73 year old male with medical history significant for DM2, diabetic macular edema of both eyes diabetic retinopathy, HTN, HLD senting to the emergency department after syncopal episode.  The patient was taken by his son to [employer] for lunch when he slumped over and went unresponsive while at lunch.  He was seated at the table.  He has had no prodrome whatsoever prior to this episode.  Per EMS, fire arrived on scene and he had an unknown downtime and they stated that fire was unable to palpate radial pulse initially, CPR was not administered.  BP was 94/50 increased to 117/70 after 500 cc normal saline.  On EMS arrival, the patient had a pulse and was recovering from his syncopal episode with improvement in mental status.  CBG was 286.  Patient arrives without complaint.  Home Medications Prior to Admission medications   Medication Sig Start Date End Date Taking? Authorizing Provider  amLODipine (NORVASC) 10 MG tablet Take 10 mg by mouth daily.    [provider]  aspirin EC 81 MG tablet Take 81 mg by mouth daily. Swallow whole.    [provider]  brimonidine-timolol (COMBIGAN) 0.2-0.5 % ophthalmic solution Place 1 drop into both eyes every morning. 06/27/19   [provider]  dorzolamide (TRUSOPT) 2 % ophthalmic solution Place 1 drop into both eyes 2 (two) times daily. 11/11/20   [provider]  gabapentin (NEURONTIN) 100 MG capsule Take 200 mg by mouth in the morning. 06/18/23   [provider]  glimepiride (AMARYL) 2 MG tablet Take 2 mg by mouth daily with breakfast.    [provider]  lisinopril (ZESTRIL) 10 MG tablet Take 10 mg by mouth daily. 07/10/13   [provider]   lovastatin (MEVACOR) 20 MG tablet Take 20 mg by mouth in the morning.    [provider]  oxyCODONE  (ROXICODONE ) 5 MG immediate release tablet Take 1 tablet (5 mg total) by mouth every 6 (six) hours as needed for severe pain (pain score 7-10). 07/29/23   Countryman, Chase, MD  sitaGLIPtin-metformin (JANUMET) 50-1000 MG tablet Take 1 tablet by mouth every morning. 08/04/14   [provider]  vitamin B-12 (CYANOCOBALAMIN) 500 MCG tablet Take 500 mcg by mouth daily.    [provider]      Allergies    Patient has no known allergies.    Review of Systems   Review of Systems  All other systems reviewed and are negative.   Physical Exam Updated Vital Signs BP 131/70   Pulse 76   Temp 97.9 F (36.6 C) (Oral)   Resp 13   Ht 5\' 8"  (1.727 m)   Wt 75.8 kg   SpO2 98%   BMI 25.41 kg/m  Physical Exam Vitals and nursing note reviewed.  Constitutional:      General: He is not in acute distress.    Appearance: He is well-developed.  HENT:     Head: Normocephalic and atraumatic.  Eyes:     Conjunctiva/sclera: Conjunctivae normal.  Cardiovascular:     Rate and Rhythm: Normal rate and regular rhythm.     Pulses: Normal pulses.  Pulmonary:     Effort: Pulmonary effort is  normal. No respiratory distress.     Breath sounds: Normal breath sounds.  Abdominal:     Palpations: Abdomen is soft.     Tenderness: There is no abdominal tenderness.  Musculoskeletal:        General: No swelling.     Cervical back: Neck supple.  Skin:    General: Skin is warm and dry.     Capillary Refill: Capillary refill takes less than 2 seconds.  Neurological:     General: No focal deficit present.     Mental Status: He is alert and oriented to person, place, and time. Mental status is at baseline.     Cranial Nerves: No cranial nerve deficit.     Sensory: No sensory deficit.     Motor: No weakness.  Psychiatric:        Mood and Affect: Mood normal.     ED Results / Procedures  / Treatments   Labs (all labs ordered are listed, but only abnormal results are displayed) Labs Reviewed  BASIC METABOLIC PANEL WITH GFR - Abnormal; Notable for the following components:      Result Value   Glucose, Bld 260 (*)    Calcium 8.8 (*)    All other components within normal limits  CBC WITH DIFFERENTIAL/PLATELET - Abnormal; Notable for the following components:   Hemoglobin 12.9 (*)    HCT 37.1 (*)    MCV 77.8 (*)    RDW 15.9 (*)    All other components within normal limits  CBG MONITORING, ED - Abnormal; Notable for the following components:   Glucose-Capillary 227 (*)    All other components within normal limits  D-DIMER, QUANTITATIVE  BRAIN NATRIURETIC PEPTIDE  TROPONIN I (HIGH SENSITIVITY)    EKG EKG Interpretation Date/Time:  Sunday Dec 10 2023 16:28:22 EDT Ventricular Rate:  73 PR Interval:  137 QRS Duration:  76 QT Interval:  366 QTC Calculation: 404 R Axis:   26  Text Interpretation: Sinus rhythm Atrial premature complexes Borderline repolarization abnormality Confirmed by Rosealee Concha ([id number]) on 12/10/2023 5:35:00 PM  Radiology DG Chest Port 1 View Result Date: 12/10/2023 CLINICAL DATA:  syncope EXAM: PORTABLE CHEST 1 VIEW COMPARISON:  July 23, 2023 FINDINGS: The cardiomediastinal silhouette is unchanged in contour. No pleural effusion. No pneumothorax. No acute pleuroparenchymal abnormality. IMPRESSION: No acute cardiopulmonary abnormality. Electronically Signed   By: Clancy Crimes M.D.   On: 12/10/2023 17:31    Procedures Procedures    Medications Ordered in ED Medications - No data to display  ED Course/ Medical Decision Making/ A&P                                 Medical Decision Making Amount and/or Complexity of Data Reviewed Labs: ordered. Radiology: ordered.  Risk Decision regarding hospitalization.    73 year old male with medical history significant for DM2, diabetic macular edema of both eyes diabetic retinopathy, HTN,  HLD senting to the emergency department after syncopal episode.  The patient was taken by his son to [employer] for lunch when he slumped over and went unresponsive while at lunch.  He was seated at the table.  He has had no prodrome whatsoever prior to this episode.  Per EMS, fire arrived on scene and he had an unknown downtime and they stated that fire was unable to palpate radial pulse initially, CPR was not administered.  BP was 94/50 increased to 117/70 after 500 cc normal  saline.  On EMS arrival, the patient had a pulse and was recovering from his syncopal episode with improvement in mental status.  CBG was 286.  Patient arrives without complaint.  Medical Decision Making:   Christopher Rivas is a 73 y.o. male who presented to the ED today with a syncopal episode detailed above.     Complete initial physical exam performed, notably the patient  was CTAB, neuro intact.    Reviewed and confirmed nursing documentation for past medical history, family history, social history.    Initial Assessment:   With the patient's presentation of syncope, most likely diagnosis is orthostatic hypotension vs vasovagal episode. Other diagnoses were considered including (but not limited to) arrythmogenic syncope, valvular abnormality, PE, aortic dissection. These are considered less likely due to history of present illness and physical exam findings.   This is most consistent with an acute life/limb threatening illness complicated by underlying chronic conditions.    Initial Plan:  Screening labs including CBC and Metabolic panel to evaluate for infectious or metabolic etiology of disease.  Trop, dimer, BNP for screening in the setting of syncope CXR to evaluate for structural/infectious intrathoracic pathology.  EKG to evaluate for cardiac pathology.  Objective evaluation as below reviewed after administration of IVF/Telemetry monitoring  Initial Study Results:   Laboratory  All laboratory results reviewed  without evidence of clinically relevant pathology.   Exceptions include: none   EKG EKG was reviewed independently. Rate, rhythm, axis, intervals all examined and without medically relevant abnormality. ST segments without concerns for elevations.    Radiology:  All images reviewed independently. Agree with radiology report at this time.   DG Chest Port 1 View Result Date: 12/10/2023 CLINICAL DATA:  syncope EXAM: PORTABLE CHEST 1 VIEW COMPARISON:  July 23, 2023 FINDINGS: The cardiomediastinal silhouette is unchanged in contour. No pleural effusion. No pneumothorax. No acute pleuroparenchymal abnormality. IMPRESSION: No acute cardiopulmonary abnormality. Electronically Signed   By: Clancy Crimes M.D.   On: 12/10/2023 17:31        Final Assessment and Plan:   Patient is a 73 year old male who presented with syncope with no prodrome, questional loss of pulses.  High risk presentation, overall reassuring workup in the emergency department, recommended admission for observation and likely echocardiogram, medicine consulted for admission.   Clinical Impression:  1. Syncope, unspecified syncope type      Admit    Final Clinical Impression(s) / ED Diagnoses Final diagnoses:  Syncope, unspecified syncope type    Rx / DC Orders ED Discharge Orders     None         Rosealee Concha, MD 12/10/23 [phone]

## 2023-12-10 NOTE — ED Triage Notes (Signed)
 Pt here via [facility] from restaurant where he passed out while eating with his son.  Pt was sitting at table and did not fall and hit head.  Unknown "down" time.  EMS states radial pulses were not palpable initially.    Bp 94/50 that increased to 117/70 after 500 ns.  Hr 70 nsr Cbg 286 Sats 98% RA

## 2023-12-11 ENCOUNTER — Encounter (HOSPITAL_COMMUNITY): Payer: Self-pay

## 2023-12-11 ENCOUNTER — Observation Stay (HOSPITAL_COMMUNITY)

## 2023-12-11 DIAGNOSIS — I5020 Unspecified systolic (congestive) heart failure: Secondary | ICD-10-CM | POA: Diagnosis not present

## 2023-12-11 DIAGNOSIS — I429 Cardiomyopathy, unspecified: Secondary | ICD-10-CM

## 2023-12-11 DIAGNOSIS — R55 Syncope and collapse: Secondary | ICD-10-CM

## 2023-12-11 DIAGNOSIS — I679 Cerebrovascular disease, unspecified: Secondary | ICD-10-CM | POA: Diagnosis not present

## 2023-12-11 LAB — HEPATIC FUNCTION PANEL
ALT: 21 U/L (ref 0–44)
AST: 19 U/L (ref 15–41)
Albumin: 3.3 g/dL — ABNORMAL LOW (ref 3.5–5.0)
Alkaline Phosphatase: 71 U/L (ref 38–126)
Bilirubin, Direct: <0.1 mg/dL (ref 0.0–0.2)
Total Bilirubin: 0.8 mg/dL (ref 0.0–1.2)
Total Protein: 7.2 g/dL (ref 6.5–8.1)

## 2023-12-11 LAB — HEMOGLOBIN A1C
Hgb A1c MFr Bld: 8.9 % — ABNORMAL HIGH (ref 4.8–5.6)
Mean Plasma Glucose: 208.73 mg/dL

## 2023-12-11 LAB — URINALYSIS, ROUTINE W REFLEX MICROSCOPIC
Bilirubin Urine: NEGATIVE
Glucose, UA: NEGATIVE mg/dL
Hgb urine dipstick: NEGATIVE
Ketones, ur: NEGATIVE mg/dL
Leukocytes,Ua: NEGATIVE
Nitrite: NEGATIVE
Protein, ur: NEGATIVE mg/dL
Specific Gravity, Urine: 1.005 (ref 1.005–1.030)
pH: 7 (ref 5.0–8.0)

## 2023-12-11 LAB — CBC
HCT: 39.1 % (ref 39.0–52.0)
HCT: 39 % (ref 39.0–52.0)
Hemoglobin: 13.9 g/dL (ref 13.0–17.0)
Hemoglobin: 13.7 g/dL (ref 13.0–17.0)
MCH: 27.4 pg (ref 26.0–34.0)
MCH: 26.9 pg (ref 26.0–34.0)
MCHC: 35.5 g/dL (ref 30.0–36.0)
MCHC: 35.1 g/dL (ref 30.0–36.0)
MCV: 77.1 fL — ABNORMAL LOW (ref 80.0–100.0)
MCV: 76.6 fL — ABNORMAL LOW (ref 80.0–100.0)
Platelets: 203 10*3/uL (ref 150–400)
Platelets: 240 10*3/uL (ref 150–400)
RBC: 5.07 MIL/uL (ref 4.22–5.81)
RBC: 5.09 MIL/uL (ref 4.22–5.81)
RDW: 15.9 % — ABNORMAL HIGH (ref 11.5–15.5)
RDW: 15.8 % — ABNORMAL HIGH (ref 11.5–15.5)
WBC: 4.9 10*3/uL (ref 4.0–10.5)
WBC: 4.7 10*3/uL (ref 4.0–10.5)
nRBC: 0 % (ref 0.0–0.2)
nRBC: 0 % (ref 0.0–0.2)

## 2023-12-11 LAB — COMPREHENSIVE METABOLIC PANEL WITH GFR
ALT: 20 U/L (ref 0–44)
AST: 18 U/L (ref 15–41)
Albumin: 3.4 g/dL — ABNORMAL LOW (ref 3.5–5.0)
Alkaline Phosphatase: 75 U/L (ref 38–126)
Anion gap: 5 (ref 5–15)
BUN: 14 mg/dL (ref 8–23)
CO2: 27 mmol/L (ref 22–32)
Calcium: 9.3 mg/dL (ref 8.9–10.3)
Chloride: 106 mmol/L (ref 98–111)
Creatinine, Ser: 1.08 mg/dL (ref 0.61–1.24)
GFR, Estimated: >60 mL/min (ref >60)
Glucose, Bld: 225 mg/dL — ABNORMAL HIGH (ref 70–99)
Potassium: 3.8 mmol/L (ref 3.5–5.1)
Sodium: 138 mmol/L (ref 135–145)
Total Bilirubin: 0.9 mg/dL (ref 0.0–1.2)
Total Protein: 7.3 g/dL (ref 6.5–8.1)

## 2023-12-11 LAB — IRON AND TIBC
Iron: 65 ug/dL (ref 45–182)
Saturation Ratios: 18 % (ref 17.9–39.5)
TIBC: 370 ug/dL (ref 250–450)
UIBC: 305 ug/dL

## 2023-12-11 LAB — GLUCOSE, CAPILLARY
Glucose-Capillary: 112 mg/dL — ABNORMAL HIGH (ref 70–99)
Glucose-Capillary: 133 mg/dL — ABNORMAL HIGH (ref 70–99)
Glucose-Capillary: 139 mg/dL — ABNORMAL HIGH (ref 70–99)
Glucose-Capillary: 163 mg/dL — ABNORMAL HIGH (ref 70–99)
Glucose-Capillary: 251 mg/dL — ABNORMAL HIGH (ref 70–99)

## 2023-12-11 LAB — CREATININE, SERUM
Creatinine, Ser: 1.14 mg/dL (ref 0.61–1.24)
GFR, Estimated: >60 mL/min (ref >60)

## 2023-12-11 LAB — ECHOCARDIOGRAM COMPLETE
Area-P 1/2: 1.74 cm2
Height: 68 in
MV M vel: 2.98 m/s
MV Peak grad: 35.5 mmHg
S' Lateral: 2.7 cm
Weight: 2832.47 [oz_av]

## 2023-12-11 LAB — VITAMIN B12: Vitamin B-12: 1207 pg/mL — ABNORMAL HIGH (ref 180–914)

## 2023-12-11 LAB — FOLATE: Folate: 19.2 ng/mL (ref >5.9)

## 2023-12-11 LAB — RETICULOCYTES
Immature Retic Fract: 14.1 % (ref 2.3–15.9)
RBC.: 5.1 MIL/uL (ref 4.22–5.81)
Retic Count, Absolute: 63.8 10*3/uL (ref 19.0–186.0)
Retic Ct Pct: 1.3 % (ref 0.4–3.1)

## 2023-12-11 LAB — FERRITIN: Ferritin: 14 ng/mL — ABNORMAL LOW (ref 24–336)

## 2023-12-11 LAB — TSH: TSH: 0.669 u[IU]/mL (ref 0.350–4.500)

## 2023-12-11 MED ORDER — INSULIN GLARGINE-YFGN 100 UNIT/ML ~~LOC~~ SOLN
12.0000 [IU] | Freq: Every day | SUBCUTANEOUS | Status: DC
  Administered 2023-12-11 – 2023-12-12 (×2): 12 [IU] via SUBCUTANEOUS
  Filled 2023-12-11 (×3): qty 0.12

## 2023-12-11 NOTE — NC FL2 (Signed)
 [facility]  MEDICAID [id number] LEVEL OF CARE FORM     IDENTIFICATION  Patient Name: Christopher Rivas Birthdate: [birth year] Sex: male Admission Date (Current Location): 12/10/2023  [facility] and [place] Number:  Producer, television/film/video and Address:  [facility], [address] [place], [place] [address]      Provider Number: [id number]  Attending Physician Name and Address:  Oral Billings, MD  Relative Name and Phone Number:       Current Level of Care: Hospital Recommended Level of Care: Skilled Nursing Facility Prior Approval Number:    Date Approved/Denied:   PASRR Number:  Existing   Discharge Plan: SNF    Current Diagnoses: Patient Active Problem List   Diagnosis Date Noted   Syncope 12/10/2023   Anemia 12/10/2023   Pain due to onychomycosis of toenails of both feet 01/03/2020   Prostate cancer (HCC) 12/10/2014   Pseudophakia of both eyes 02/05/2014   DM type 2 (diabetes mellitus, type 2) (HCC) 09/05/2013   HTN (hypertension) 09/05/2013   Hyperlipidemia 09/05/2013   Other states following surgery of eye and adnexa 10/05/2012   OAG (open angle glaucoma) 08/29/2012   Diabetic macular edema of both eyes (HCC) 04/06/2012   PDR (proliferative diabetic retinopathy) (HCC) 04/06/2012    Orientation RESPIRATION BLADDER Height & Weight     Self, Situation, Place  Normal Incontinent Weight: 177 lb 0.5 oz (80.3 kg) Height:  5\' 8"  (172.7 cm)  BEHAVIORAL SYMPTOMS/MOOD NEUROLOGICAL BOWEL NUTRITION STATUS      Incontinent Diet (See dc summary)  AMBULATORY STATUS COMMUNICATION OF NEEDS Skin   Independent Verbally Normal                       Personal Care Assistance Level of Assistance  Bathing, Dressing, Feeding Bathing Assistance: Limited assistance Feeding assistance: Limited assistance Dressing Assistance: Limited assistance     Functional Limitations Info  Sight Sight Info: Impaired (Blind)        SPECIAL CARE FACTORS FREQUENCY                        Contractures Contractures Info: Not present    Additional Factors Info  Code Status, Allergies Code Status Info: Full Allergies Info: NKA           Current Medications (12/11/2023):  This is the current hospital active medication list Current Facility-Administered Medications  Medication Dose Route Frequency Provider Last Rate Last Admin   acetaminophen  (TYLENOL ) tablet 650 mg  650 mg Oral Q6H PRN Angelene Kelly, MD       Or   acetaminophen  (TYLENOL ) suppository 650 mg  650 mg Rectal Q6H PRN Angelene Kelly, MD       aspirin  EC tablet 81 mg  81 mg Oral Daily Kakrakandy, Arshad N, MD   81 mg at 12/11/23 0945   brimonidine  (ALPHAGAN ) 0.2 % ophthalmic solution 1 drop  1 drop Both Eyes Daily Angelene Kelly, MD   1 drop at 12/11/23 0946   And   timolol  (TIMOPTIC ) 0.5 % ophthalmic solution 1 drop  1 drop Both Eyes Daily Kakrakandy, Arshad N, MD   1 drop at 12/11/23 0946   dorzolamide  (TRUSOPT ) 2 % ophthalmic solution 1 drop  1 drop Both Eyes BID Angelene Kelly, MD   1 drop at 12/11/23 0947   enoxaparin  (LOVENOX ) injection 40 mg  40 mg Subcutaneous Q24H Kakrakandy, Arshad N, MD   40 mg  at 12/11/23 0945   hydrALAZINE  (APRESOLINE ) injection 5 mg  5 mg Intravenous Q4H PRN Kakrakandy, Arshad N, MD       insulin  aspart (novoLOG ) injection 0-9 Units  0-9 Units Subcutaneous TID WC Kakrakandy, Arshad N, MD   1 Units at 12/11/23 0957   insulin  glargine-yfgn (SEMGLEE ) injection 12 Units  12 Units Subcutaneous QHS Oral Billings, MD       lactated ringers  infusion   Intravenous Continuous Angelene Kelly, MD   Stopped at 12/11/23 0730   pravastatin  (PRAVACHOL ) tablet 20 mg  20 mg Oral q1800 Kakrakandy, Arshad N, MD         Discharge Medications: Please see discharge summary for a list of discharge medications.  Relevant Imaging Results:  Relevant Lab Results:   Additional Information SSN: [id number]  Jannice Mends, LCSW

## 2023-12-11 NOTE — Progress Notes (Signed)
  Progress Note   Patient: Christopher Rivas WUJ:[id number] DOB: 02-07-1951 DOA: 12/10/2023     0 DOS: the patient was seen and examined on 12/11/2023   Brief hospital course: 73 y.o. male with history of diabetes mellitus type 2, hypertension, hyperlipidemia, chronic anemia was brought to the ER after patient had a syncopal episode.  As per the patient's sister patient and the family were having a family get together having lunch when patient suddenly started becoming lethargic confused and lost consciousness.  He was not responding to the questions well but soon became awake appeared confused.  Did not have any incontinence of urine or bowel.  Patient was holding onto his stomach when this episode happened.  The whole episode lasted around twenty minutes.  EMS on arrival found patient's blood pressure was 90 systolic with CBG of 286.  Patient was given 500 cc fluid bolus and brought to the ER.     Per patient's family patient has had similar episode at least 3 times in the last few months.  Patient was recently started on insulin  for his diabetes melitis at his living facility last month.  Assessment and Plan:  Syncope -    Unclear etiology.  Pt did have abd discomfort and nausea prior to symptom onset. CT abd reviewed, notable for mod stool burden in a location that correlates to pt's abd discomfort. Possible vasovagal Head imaging unremarkable.   Orthostatics neg Of note, 2d echo is notable for new EF of 30%. Cardiology consulted per below Hypertension since at presentation was hypotensive holding amlodipine and lisinopril.  Will keep patient on as needed IV hydralazine  and follow blood pressure trends. GDMT per Cardiology below Diabetes mellitus type 2 with hyperglycemia and as per the family patient was recently started on insulin  last month.   On oral hypoglycemics PTA.   For now continued on insulin  glargine 7 units at bedtime along with sliding scale coverage. Hyperlipidemia on  statins. Anemia appears to be chronic check anemia panel. Prior history of prostate cancer. New systoilc heart failure, new diagnosis New EF of 30% Appreciate Cardiology input. Recs to titrate GDMT as BP allows  and possible repeat echo in 2-3 months     Subjective: Feeling better. Eager to go home  Physical Exam: Vitals:   12/10/23 1900 12/10/23 2302 12/11/23 0432 12/11/23 1548  BP: 131/70 135/86 131/70 113/70  Pulse: 76 82 77   Resp: 13 17 16    Temp:  98.1 F (36.7 C) 98.4 F (36.9 C) 98 F (36.7 C)  TempSrc:  Oral Oral Oral  SpO2: 98% 96% 100% 100%  Weight:  80.3 kg    Height:  5\' 8"  (1.727 m)     General exam: Awake, laying in bed, in nad Respiratory system: Normal respiratory effort, no wheezing Cardiovascular system: regular rate, s1, s2 Gastrointestinal system: Soft, nondistended, positive BS Central nervous system: CN2-12 grossly intact, strength intact Extremities: Perfused, no clubbing Skin: Normal skin turgor, no notable skin lesions seen Psychiatry: Mood normal // no visual hallucinations   Data Reviewed:  Labs reviewed: Na 138, K 3.8, Cr 1.08, WBC 4.7, hgb 13.7, Plts 240  Family Communication: Pt in room, family at bedside  Disposition: Status is: Observation The patient remains OBS appropriate and will d/c before 2 midnights.  Planned Discharge Destination: Skilled nursing facility    Author: Cherylle Corwin, MD 12/11/2023 5:36 PM  For on call review [email or web address].

## 2023-12-11 NOTE — TOC Initial Note (Signed)
 Transition of Care [facility]) - Initial/Assessment Note    Patient Details  Name: Christopher Rivas MRN: [id number] Date of Birth: 06-Sep-1950  [facility] CM/SW Contact:    Jannice Mends, LCSW Phone Number: 12/11/2023, 3:24 PM  Clinical Narrative:                 Patient admitted from [facility] SNF long term care. CSW received request from RN to speak with family in room regarding alternate SNFs. Family had left upon arrival so CSW spoke with patient's sister as she is the only one listed on the [place]. She explained that it was likely patient's daughter, [name], and that they are hoping patient can be moved to a room closer to the front at [facility]. She expressed understanding about difficulty in finding a different long term care Medicaid SNF and that CSW left list in patient's room for the future. She reported that staff should let their brother, Pascual Bonds [phone]) know when he is ready for discharge so he can provide transport.   Expected Discharge Plan: Skilled Nursing Facility Barriers to Discharge: Continued Medical Work up   Patient Goals and CMS Choice Patient states their goals for this hospitalization and ongoing recovery are:: Return to snf CMS Medicare.gov Compare Post Acute Care list provided to:: Patient Represenative (must comment) Choice offered to / list presented to : Sibling      Expected Discharge Plan and Services In-house Referral: Clinical Social Work   Post Acute Care Choice: Skilled Nursing Facility Living arrangements for the past 2 months: Skilled Nursing Facility                                      Prior Living Arrangements/Services Living arrangements for the past 2 months: Skilled Nursing Facility Lives with:: Facility Resident Patient language and need for interpreter reviewed:: Yes Do you feel safe going back to the place where you live?: Yes      Need for Family Participation in Patient Care: Yes (Comment) Care giver support  system in place?: Yes (comment)   Criminal Activity/Legal Involvement Pertinent to Current Situation/Hospitalization: No - Comment as needed  Activities of Daily Living   ADL Screening (condition at time of admission) Independently performs ADLs?: No Does the patient have a NEW difficulty with bathing/dressing/toileting/self-feeding that is expected to last >3 days?: Yes (Initiates electronic notice to provider for possible OT consult) Does the patient have a NEW difficulty with getting in/out of bed, walking, or climbing stairs that is expected to last >3 days?: Yes (Initiates electronic notice to provider for possible PT consult) Does the patient have a NEW difficulty with communication that is expected to last >3 days?: Yes (Initiates electronic notice to provider for possible SLP consult) Is the patient deaf or have difficulty hearing?: Yes Does the patient have difficulty seeing, even when wearing glasses/contacts?: Yes Does the patient have difficulty concentrating, remembering, or making decisions?: Yes  Permission Sought/Granted Permission sought to share information with : Facility Medical sales representative, Family Supports Permission granted to share information with : Yes, Verbal Permission Granted  Share Information with NAME: Diane  Permission granted to share info w AGENCY: [facility]  Permission granted to share info w Relationship: Sister  Permission granted to share info w Contact Information: [phone]  Emotional Assessment Appearance:: Appears stated age Attitude/Demeanor/Rapport: Gracious Affect (typically observed): Accepting Orientation: : Oriented to Self, Oriented to Situation, Oriented to Place  Alcohol / Substance Use: Not Applicable Psych Involvement: No (comment)  Admission diagnosis:  Syncope [R55] Syncope, unspecified syncope type [R55] Patient Active Problem List   Diagnosis Date Noted   Syncope 12/10/2023   Anemia 12/10/2023   Pain due to  onychomycosis of toenails of both feet 01/03/2020   Prostate cancer (HCC) 12/10/2014   Pseudophakia of both eyes 02/05/2014   DM type 2 (diabetes mellitus, type 2) (HCC) 09/05/2013   HTN (hypertension) 09/05/2013   Hyperlipidemia 09/05/2013   Other states following surgery of eye and adnexa 10/05/2012   OAG (open angle glaucoma) 08/29/2012   Diabetic macular edema of both eyes (HCC) 04/06/2012   PDR (proliferative diabetic retinopathy) (HCC) 04/06/2012   PCP:  Group, [facility]:   [facility] - [place], [place] - [address] [place] [address] Phone: [phone] Fax: [phone]     Social Drivers of Health (SDOH) Social History: SDOH Screenings   Food Insecurity: Patient Declined (12/11/2023)  Housing: Patient Declined (12/11/2023)  Transportation Needs: No Transportation Needs (12/11/2023)  Utilities: Not At Risk (12/11/2023)  Social Connections: Patient Declined (12/11/2023)  Tobacco Use: Low Risk  (12/10/2023)   SDOH Interventions:     Readmission Risk Interventions     No data to display

## 2023-12-11 NOTE — Inpatient Diabetes Management (Signed)
 Inpatient Diabetes Program Recommendations  AACE/ADA: New Consensus Statement on Inpatient Glycemic Control (2015)  Target Ranges:  Prepandial:   less than 140 mg/dL      Peak postprandial:   less than 180 mg/dL (1-2 hours)      Critically ill patients:  140 - 180 mg/dL   Lab Results  Component Value Date   GLUCAP 251 (H) 12/11/2023   HGBA1C 8.9 (H) 12/11/2023    Review of Glycemic Control  Diabetes history: DM 2 Outpatient Diabetes medications: Amaryl 2 mg Daily, Janumet 50-1000 mg Daily Current orders for Inpatient glycemic control:  Semglee  7 units qhs Novolog  0-9 units tid  A1c 8.9% ob 5/19 Note: Semglee  7 units give last night and glucose was 251 this am.  Inpatient Diabetes Program Recommendations:    -   Increase Semglee  to 12 units  Thanks,  Eloise Hake RN, MSN, BC-ADM Inpatient Diabetes Coordinator Team Pager [phone] (8a-5p)

## 2023-12-11 NOTE — Consult Note (Signed)
 Cardiology Consultation   Patient ID: CHISTIAN KASLER MRN: [id number]; DOB: Nov 19, 1950  Admit date: 12/10/2023 Date of Consult: 12/11/2023  PCP:  [facility]   [facility] Providers Cardiologist:  None   {  Patient Profile:   ASPEN DETERDING is a 73 y.o. male with a hx of type 2 diabetes, hypertension, hyperlipidemia, chronic anemia, vision loss who is being seen 12/11/2023 for the evaluation of unresponsiveness/syncope/new heart failure at the request of Dr. Joan Mouton.  History of Present Illness:   Mr. Townley per chart review does not appear to have any cardiac history.  Attempted to go see patient however he was hiding underneath his sheets and said he was going to go home today and declined my interview.  Attempted to call sister who was confused and speaking random sentences on the phone and eventually hung up.  I called her 1 more time and no response.  I also attempted to call brother, went straight to voicemail.  Currently patient being evaluated for an episode of sudden lethargy, confusion and loss of consciousness while at family lunch.  Reportedly was not responding to questions appropriately.  Episode lasted approximately 20 minutes no evidence of seizure activity.  He was hypotensive on arrival with 90 systolic but BGL 409.  Given fluids with improvement in blood pressure.  Brain MRI indicated no acute pathology however did have very advanced chronic small vessel disease.  Advanced chronic demyelinating disease or combination of both.  Etiology still unclear, workup so far unremarkable.  Echo does show newly reduced EF 30 to 35% cardiology consulted.   UA still pending, potassium 3.8.  Normal creatinine.  BNP normal.  Troponin normal.  Hemoglobin 13.7.  A1c 8.9%.  Past Medical History:  Diagnosis Date   Arthritis    Diabetes mellitus    Glaucoma    Hypercholesterolemia    Hypertension    Prostate cancer (HCC)    Vision loss     Past Surgical History:   Procedure Laterality Date   cataract surgery     EYE SURGERY     PROSTATE BIOPSY       Inpatient Medications: Scheduled Meds:  aspirin  EC  81 mg Oral Daily   brimonidine   1 drop Both Eyes Daily   And   timolol   1 drop Both Eyes Daily   dorzolamide   1 drop Both Eyes BID   enoxaparin  (LOVENOX ) injection  40 mg Subcutaneous Q24H   insulin  aspart  0-9 Units Subcutaneous TID WC   insulin  glargine-yfgn  12 Units Subcutaneous QHS   pravastatin   20 mg Oral q1800   Continuous Infusions:  lactated ringers  Stopped (12/11/23 0730)   PRN Meds: acetaminophen  **OR** acetaminophen , hydrALAZINE   Allergies:   No Known Allergies  Social History:   Social History   Socioeconomic History   Marital status: Married    Spouse name: Not on file   Number of children: Not on file   Years of education: Not on file   Highest education level: Not on file  Occupational History   Not on file  Tobacco Use   Smoking status: Never   Smokeless tobacco: Never  Substance and Sexual Activity   Alcohol use: No   Drug use: No   Sexual activity: Not Currently  Other Topics Concern   Not on file  Social History Narrative   Not on file   Social Drivers of Health   Financial Resource Strain: Not on file  Food Insecurity: Patient Declined (12/11/2023)  Hunger Vital Sign    Worried About Running Out of Food in the Last Year: Patient declined    Ran Out of Food in the Last Year: Patient declined  Transportation Needs: No Transportation Needs (12/11/2023)   PRAPARE - Administrator, Civil Service (Medical): No    Lack of Transportation (Non-Medical): No  Physical Activity: Not on file  Stress: Not on file  Social Connections: Patient Declined (12/11/2023)   Social Connection and Isolation Panel [NHANES]    Frequency of Communication with Friends and Family: Patient declined    Frequency of Social Gatherings with Friends and Family: Patient declined    Attends Religious Services: Patient  declined    Database administrator or Organizations: Patient declined    Attends Banker Meetings: Patient declined    Marital Status: Patient declined  Intimate Partner Violence: Not At Risk (12/11/2023)   Humiliation, Afraid, Rape, and Kick questionnaire    Fear of Current or Ex-Partner: No    Emotionally Abused: No    Physically Abused: No    Sexually Abused: No    Family History:   Family History  Problem Relation Age of Onset   Cancer Brother        prostate   Cancer Brother        prostate     ROS:  Please see the history of present illness.  All other ROS reviewed and negative.     Physical Exam/Data:   Vitals:   12/10/23 1900 12/10/23 2302 12/11/23 0432 12/11/23 1548  BP: 131/70 135/86 131/70 113/70  Pulse: 76 82 77   Resp: 13 17 16    Temp:  98.1 F (36.7 C) 98.4 F (36.9 C) 98 F (36.7 C)  TempSrc:  Oral Oral Oral  SpO2: 98% 96% 100% 100%  Weight:  80.3 kg    Height:  5\' 8"  (1.727 m)      Intake/Output Summary (Last 24 hours) at 12/11/2023 1619 Last data filed at 12/11/2023 1521 Gross per 24 hour  Intake 425.45 ml  Output 600 ml  Net -174.55 ml      12/10/2023   11:02 PM 12/10/2023    4:24 PM 07/23/2023    1:30 PM  Last 3 Weights  Weight (lbs) 177 lb 0.5 oz 167 lb 1.7 oz 167 lb  Weight (kg) 80.3 kg 75.8 kg 75.751 kg     Body mass index is 26.92 kg/m.   Physical exam limited because patient declined and was hiding underneath the sheets.  However grossly does not appear to be in any respiratory distress and did not note any chest pain.  EKG:  The EKG was personally reviewed and demonstrates: Sinus rhythm heart rate in the 70s.  Inferolateral ST downsloping Telemetry:  Telemetry was personally reviewed and demonstrates: Sinus rhythm heart rates in the 70s  Relevant CV Studies: Echocardiogram 12/11/2023 1. Left ventricular ejection fraction, by estimation, is 30 to 35%. The  left ventricle has moderately decreased function. The left  ventricle  demonstrates global hypokinesis. Left ventricular diastolic parameters are  consistent with Grade I diastolic  dysfunction (impaired relaxation).   2. Right ventricular systolic function is normal. The right ventricular  size is normal.   3. The mitral valve is normal in structure. Trivial mitral valve  regurgitation. No evidence of mitral stenosis.   4. The aortic valve is tricuspid. Aortic valve regurgitation is not  visualized. No aortic stenosis is present.   5. The inferior vena cava is  normal in size with greater than 50%  respiratory variability, suggesting right atrial pressure of 3 mmHg.   Comparison(s): No prior Echocardiogram.   Laboratory Data:  High Sensitivity Troponin:   Recent Labs  Lab 12/10/23 1626  TROPONINIHS 9     Chemistry Recent Labs  Lab 12/10/23 1626 12/11/23 0140 12/11/23 0538  NA 137  --  138  K 4.3  --  3.8  CL 107  --  106  CO2 23  --  27  GLUCOSE 260*  --  225*  BUN 17  --  14  CREATININE 1.18 1.14 1.08  CALCIUM 8.8*  --  9.3  GFRNONAA >60 >60 >60  ANIONGAP 7  --  5    Recent Labs  Lab 12/11/23 0140 12/11/23 0538  PROT 7.2 7.3  ALBUMIN 3.3* 3.4*  AST 19 18  ALT 21 20  ALKPHOS 71 75  BILITOT 0.8 0.9   Lipids No results for input(s): "CHOL", "TRIG", "HDL", "LABVLDL", "LDLCALC", "CHOLHDL" in the last 168 hours.  Hematology Recent Labs  Lab 12/10/23 1626 12/11/23 0140 12/11/23 0538  WBC 5.5 4.9 4.7  RBC 4.77 5.07  5.10 5.09  HGB 12.9* 13.9 13.7  HCT 37.1* 39.1 39.0  MCV 77.8* 77.1* 76.6*  MCH 27.0 27.4 26.9  MCHC 34.8 35.5 35.1  RDW 15.9* 15.9* 15.8*  PLT 202 203 240   Thyroid  Recent Labs  Lab 12/11/23 0140  TSH 0.669    BNP Recent Labs  Lab 12/10/23 1626  BNP 17.0    DDimer  Recent Labs  Lab 12/10/23 1626  DDIMER 0.47     Radiology/Studies:  ECHOCARDIOGRAM COMPLETE Result Date: 12/11/2023    ECHOCARDIOGRAM REPORT   Patient Name:   LAMAR METER Date of Exam: 12/11/2023 Medical Rec #:   [id number]     Height:       68.0 in Accession #:    [id number]    Weight:       177.0 lb Date of Birth:  11-17-1950     BSA:          1.940 m Patient Age:    73 years      BP:           131/70 mmHg Patient Gender: M             HR:           72 bpm. Exam Location:  Inpatient Procedure: 2D Echo, Cardiac Doppler and Color Doppler (Both Spectral and Color            Flow Doppler were utilized during procedure). Indications:    Syncope R55  History:        Patient has no prior history of Echocardiogram examinations.                 Signs/Symptoms:Syncope; Risk Factors:Diabetes, Hypertension and                 Dyslipidemia.  Sonographer:    Kip Peon RDCS Referring Phys: [id number] ARSHAD N KAKRAKANDY IMPRESSIONS  1. Left ventricular ejection fraction, by estimation, is 30 to 35%. The left ventricle has moderately decreased function. The left ventricle demonstrates global hypokinesis. Left ventricular diastolic parameters are consistent with Grade I diastolic dysfunction (impaired relaxation).  2. Right ventricular systolic function is normal. The right ventricular size is normal.  3. The mitral valve is normal in structure. Trivial mitral valve regurgitation. No evidence of mitral stenosis.  4. The aortic valve is  tricuspid. Aortic valve regurgitation is not visualized. No aortic stenosis is present.  5. The inferior vena cava is normal in size with greater than 50% respiratory variability, suggesting right atrial pressure of 3 mmHg. Comparison(s): No prior Echocardiogram. FINDINGS  Left Ventricle: Left ventricular ejection fraction, by estimation, is 30 to 35%. The left ventricle has moderately decreased function. The left ventricle demonstrates global hypokinesis. The left ventricular internal cavity size was normal in size. There is no left ventricular hypertrophy. Left ventricular diastolic parameters are consistent with Grade I diastolic dysfunction (impaired relaxation). Right Ventricle: The right ventricular size  is normal. Right ventricular systolic function is normal. Left Atrium: Left atrial size was normal in size. Right Atrium: Right atrial size was normal in size. Pericardium: There is no evidence of pericardial effusion. Mitral Valve: The mitral valve is normal in structure. Trivial mitral valve regurgitation. No evidence of mitral valve stenosis. Tricuspid Valve: The tricuspid valve is normal in structure. Tricuspid valve regurgitation is trivial. No evidence of tricuspid stenosis. Aortic Valve: The aortic valve is tricuspid. Aortic valve regurgitation is not visualized. No aortic stenosis is present. Pulmonic Valve: The pulmonic valve was normal in structure. Pulmonic valve regurgitation is not visualized. No evidence of pulmonic stenosis. Aorta: The aortic root is normal in size and structure. Venous: The inferior vena cava is normal in size with greater than 50% respiratory variability, suggesting right atrial pressure of 3 mmHg. IAS/Shunts: No atrial level shunt detected by color flow Doppler.  LEFT VENTRICLE PLAX 2D LVIDd:         3.40 cm   Diastology LVIDs:         2.70 cm   LV e' medial:    2.94 cm/s LV PW:         1.10 cm   LV E/e' medial:  13.3 LV IVS:        1.10 cm   LV e' lateral:   8.16 cm/s LVOT diam:     1.80 cm   LV E/e' lateral: 4.8 LV SV:         34 LV SV Index:   18 LVOT Area:     2.54 cm  RIGHT VENTRICLE RV Basal diam:  2.70 cm RV S prime:     6.53 cm/s TAPSE (M-mode): 1.0 cm LEFT ATRIUM             Index        RIGHT ATRIUM          Index LA diam:        3.10 cm 1.60 cm/m   RA Area:     9.90 cm LA Vol (A2C):   28.3 ml 14.58 ml/m  RA Volume:   19.20 ml 9.89 ml/m LA Vol (A4C):   24.3 ml 12.52 ml/m LA Biplane Vol: 27.2 ml 14.02 ml/m  AORTIC VALVE LVOT Vmax:   56.20 cm/s LVOT Vmean:  38.200 cm/s LVOT VTI:    0.135 m  AORTA Ao Root diam: 3.20 cm Ao Asc diam:  3.20 cm MITRAL VALVE MV Area (PHT): 1.74 cm    SHUNTS MV Decel Time: 437 msec    Systemic VTI:  0.14 m MR Peak grad: 35.5 mmHg     Systemic Diam: 1.80 cm MR Vmax:      298.00 cm/s MV E velocity: 39.00 cm/s MV A velocity: 85.80 cm/s MV E/A ratio:  0.45 Alexandria Angel MD Electronically signed by Alexandria Angel MD Signature Date/Time: 12/11/2023/1:42:08 PM    Final  MR BRAIN WO CONTRAST Result Date: 12/11/2023 CLINICAL DATA:  73 year old male with memory loss. Altered mental status. Reported history of diabetes and hypertension. EXAM: MRI HEAD WITHOUT CONTRAST TECHNIQUE: Multiplanar, multiecho pulse sequences of the brain and surrounding structures were obtained without intravenous contrast. COMPARISON:  Head CT yesterday. FINDINGS: Brain: Appearance of chronic severe mid brain, brainstem volume loss, deep gray nuclei volume loss. With superimposed extensive underlying chronic bilateral basal ganglia, bi thalamic, pontine lacunar infarcts. Severe corpus callosum volume loss also with extensive bilateral cerebral white matter T2 and FLAIR hyperintensity superimposed on areas of CSF isointense white matter cystic encephalomalacia in both hemispheres. Patchy small chronic right cerebellum PICA territory infarct with encephalomalacia. On SWI only a small number of punctate chronic microhemorrhages are identified in the cerebral hemispheres (series 7, image 75). And no definite supratentorial cortical encephalomalacia is identified. No restricted diffusion to suggest acute infarction. No midline shift, mass effect, evidence of mass lesion, ventriculomegaly, extra-axial collection or acute intracranial hemorrhage. Cervicomedullary junction and pituitary are within normal limits. Vascular: Major intracranial vascular flow voids are preserved. Skull and upper cervical spine: Normal for age visible cervical spine. Visualized bone marrow signal is within normal limits. Sinuses/Orbits: Postoperative changes to both globes. Questionable asymmetric optic nerve atrophy greater on the left (series 9, image 25). Minor paranasal sinus mucosal thickening.  Other: Mastoids are clear. Grossly normal visible internal auditory structures. Negative visible scalp soft tissue; small left anterior frontal sebaceous cyst. IMPRESSION: 1. No acute intracranial abnormality, but chronic severe changes in the brain and brainstem. Main differential considerations are: - very advanced chronic small vessel disease throughout the cerebral white matter, bilateral deep gray nuclei, and pons. - advanced chronic demyelinating disease. - or a combination. 2. 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 Electronically Signed   By: Marlise Simpers M.D.   On: 12/11/2023 10:03   CT RENAL STONE STUDY Result Date: 12/10/2023 CLINICAL DATA:  Abdominal pain, flank pain EXAM: CT ABDOMEN AND PELVIS WITHOUT CONTRAST TECHNIQUE: Multidetector CT imaging of the abdomen and pelvis was performed following the standard protocol without IV contrast. RADIATION DOSE REDUCTION: This exam was performed according to the departmental dose-optimization program which includes automated exposure control, adjustment of the mA and/or kV according to patient size and/or use of iterative reconstruction technique. COMPARISON:  10/22/2021 FINDINGS: Lower chest: Diffuse 3 vessel coronary artery disease, scattered aortic atherosclerosis. No acute findings. Hepatobiliary: Small layering gallstones within the gallbladder. No focal hepatic abnormality. Pancreas: No focal abnormality or ductal dilatation. Spleen: No focal abnormality.  Normal size. Adrenals/Urinary Tract: No adrenal abnormality. No focal renal abnormality. No stones or hydronephrosis. Urinary bladder is unremarkable. Stomach/Bowel: Moderate stool burden in the left colon. Stomach, large and small bowel grossly unremarkable. Normal appendix. Vascular/Lymphatic: Aortic atherosclerosis. No evidence of aneurysm or adenopathy. Reproductive: Prostate enlargement. Other: No free fluid or free air. Musculoskeletal: No acute bony abnormality. IMPRESSION:  No renal or ureteral stones.  No hydronephrosis. Small layering gallstones.  No evidence of acute cholecystitis. Moderate stool burden in the left colon. Prostatomegaly. Aortic atherosclerosis. Electronically Signed   By: Janeece Mechanic M.D.   On: 12/10/2023 22:04   CT HEAD WO CONTRAST ( ) Result Date: 12/10/2023 CLINICAL DATA:  Memory loss EXAM: CT HEAD WITHOUT CONTRAST TECHNIQUE: Contiguous axial images were obtained from the base of the skull through the vertex without intravenous contrast. RADIATION DOSE REDUCTION: This exam was performed according to the departmental dose-optimization program which includes automated exposure control, adjustment of the mA  and/or kV according to patient size and/or use of iterative reconstruction technique. COMPARISON:  CT head 07/23/2023. 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 Remote right PICA territory infarct. No evidence of new/interval acute large vascular territory infarct, acute hemorrhage, mass lesion or midline shift. No hydrocephalus. Vascular: No hyperdense vessel.  Calcific atherosclerosis. Skull: No acute fracture. Sinuses/Orbits: Clear sinuses.  No acute orbital findings. Other: No mastoid effusions. IMPRESSION: 1. No evidence of acute intracranial abnormality. 2. Multiple remote infarcts. Electronically Signed   By: Stevenson Elbe M.D.   On: 12/10/2023 21:27   DG Chest Port 1 View Result Date: 12/10/2023 CLINICAL DATA:  syncope EXAM: PORTABLE CHEST 1 VIEW COMPARISON:  July 23, 2023 FINDINGS: The cardiomediastinal silhouette is unchanged in contour. No pleural effusion. No pneumothorax. No acute pleuroparenchymal abnormality. IMPRESSION: No acute cardiopulmonary abnormality. Electronically Signed   By: Clancy Crimes M.D.   On: 12/10/2023 17:31     Assessment and Plan:   Reported syncope/unresponsiveness Patient declined interview so therefore very difficult to  adequately assess.  Family members were not reachable.  However, reviewed the chart and there is not any obvious etiologies nor are there any indications that this was driven by a cardiac event.  No arrhythmias noted on telemetry, no pauses.  Troponins are negative.  Echocardiogram does show newly reduced EF but no significant valvular disease, do not think this is clinically significant in this context.  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  Not sure how much this could be contributory, defer workup to primary team/neurology. UA still not obtained, asked nurse to get today. Has no electrolyte derangements, glucose has been elevated so not likely hypoglycemic event. Polypharmacy also does not seem likely on current regiment. May consider heart monitor given lack of any other findings, though no indication of arrhythmias.  New onset HFrEF Grossly does not appear volume up although not able to perform physical exam. Had normal BNP and no findings of pulmonary congestion on imaging.  He did not note any chest pain or shortness of breath. Echocardiogram with EF 30 to 35% with global hypokinesis, normal RV function, compressible/normal IVC. Would reassess more closely tomorrow if he is amenable, however for the time being does not seem to be symptomatic and overall compensated.  EKG and wall motion do not indicate that he has had prior ischemic event. Would titrate GDMT if he and family are amenable and repeat echocardiogram in 2 to 3 months.  If EF is still down may consider ischemic evaluation at that point or if symptoms prompt sooner evaluation. Also was hypotensive on arrival, given IV fluids with improvement in EF but BP still somewhat soft. Was on lisinopril PTA, may be transition to ARB/Entresto if BP tolerates.  Would stop amlodipine if this gets in the way of GDMT.     Risk Assessment/Risk Scores:        [place]  Heart Association (NYHA) Functional Class NYHA Class II        For questions or updates, please contact [facility] Please consult [email or web address] for contact info under    Signed, Burnetta Cart, PA-C  12/11/2023 4:19 PM

## 2023-12-11 NOTE — Hospital Course (Signed)
 73 y.o. male with history of diabetes mellitus type 2, hypertension, hyperlipidemia, chronic anemia was brought to the ER after patient had a syncopal episode.  As per the patient's sister patient and the family were having a family get together having lunch when patient suddenly started becoming lethargic confused and lost consciousness.  He was not responding to the questions well but soon became awake appeared confused.  Did not have any incontinence of urine or bowel.  Patient was holding onto his stomach when this episode happened.  The whole episode lasted around twenty minutes.  EMS on arrival found patient's blood pressure was 90 systolic with CBG of 286.  Patient was given 500 cc fluid bolus and brought to the ER.     Per patient's family patient has had similar episode at least 3 times in the last few months.  Patient was recently started on insulin  for his diabetes melitis at his living facility last month.

## 2023-12-11 NOTE — Evaluation (Signed)
 Physical Therapy Evaluation  Patient Details Name: Christopher Rivas MRN: [id number] DOB: [birth year] Today's Date: 12/11/2023  History of Present Illness  Pt is a 73 y/o male who presents 12/10/2023 after LOC while eating lunch with his son. Pt sitting down and did not fall. Upon EMS arrival, they initially could not find a radial pulse. BP in 90's systolically. PMH significant for DM, glaucoma, HTN, vision loss.   Clinical Impression  Pt admitted with above diagnosis. Pt currently with functional limitations due to the deficits listed below (see PT Problem List). At the time of PT eval pt was able to perform transfers and ambulation with gross min guard assist to min assist and SPC for support. Assist mainly for way finding due to partial blindness and pt in unfamiliar area. Anticipate pt will be safe for return to his independent living. Acutely, pt will benefit from skilled PT to increase their independence and safety with mobility to allow discharge.       If plan is discharge home, recommend the following: A little help with walking and/or transfers;A little help with bathing/dressing/bathroom;Assistance with cooking/housework;Assist for transportation;Help with stairs or ramp for entrance   Can travel by private vehicle        Equipment Recommendations None recommended by PT  Recommendations for Other Services       Functional Status Assessment Patient has had a recent decline in their functional status and demonstrates the ability to make significant improvements in function in a reasonable and predictable amount of time.     Precautions / Restrictions Precautions Precautions: Fall;Other (comment) (Pt partially blind) Recall of Precautions/Restrictions: Impaired Restrictions Weight Bearing Restrictions Per Provider Order: No      Mobility  Bed Mobility Overal bed mobility: Needs Assistance Bed Mobility: Supine to Sit     Supine to sit: Supervision     General bed mobility  comments: HOB elevated. No assist required.    Transfers Overall transfer level: Needs assistance Equipment used: Straight cane Transfers: Sit to/from Stand Sit to Stand: Contact guard assist           General transfer comment: Light guarding at gait belt as pt powered up to full stand.    Ambulation/Gait Ambulation/Gait assistance: Min assist Gait Distance (Feet): 100 Feet Assistive device: Straight cane Gait Pattern/deviations: Step-through pattern, Decreased stride length, Trunk flexed, Drifts right/left (Decreased floor clearance.) Gait velocity: Decreased Gait velocity interpretation: <1.31 ft/sec, indicative of household ambulator   General Gait Details: Light assist for steering and cues so pt knew where he was in the room or hall. No overt LOB noted. Pt reports he feels near baseline.  Stairs            Wheelchair Mobility     Tilt Bed    Modified Rankin (Stroke Patients Only)       Balance Overall balance assessment: Needs assistance Sitting-balance support: Feet supported, No upper extremity supported Sitting balance-Leahy Scale: Fair     Standing balance support: Single extremity supported, During functional activity Standing balance-Leahy Scale: Fair Standing balance comment: with cane                             Pertinent Vitals/Pain Pain Assessment Pain Assessment: No/denies pain    Home Living Family/patient expects to be discharged to:: Other (Comment)                   Additional Comments: Pt currently residing in Independent  Living.    Prior Function Prior Level of Function : Independent/Modified Independent             Mobility Comments: SPC all the time ADLs Comments: Pt reports he does not require assistance for bathing/dressing.     Extremity/Trunk Assessment   Upper Extremity Assessment Upper Extremity Assessment: RUE deficits/detail RUE Deficits / Details: Pt holds cane in R hand during  ambulation. Functional in that he can bend down and get B shoes on utilizing both hands.    Lower Extremity Assessment Lower Extremity Assessment: Generalized weakness (mild)       Communication   Communication Communication: No apparent difficulties    Cognition Arousal: Alert Behavior During Therapy: WFL for tasks assessed/performed   PT - Cognitive impairments: No family/caregiver present to determine baseline (Likely at/near baseline.)                         Following commands: Intact       Cueing Cueing Techniques: Verbal cues, Gestural cues     General Comments      Exercises     Assessment/Plan    PT Assessment Patient needs continued PT services  PT Problem List Decreased strength;Decreased activity tolerance;Decreased mobility;Decreased balance;Decreased knowledge of use of DME;Decreased safety awareness;Decreased knowledge of precautions;Pain       PT Treatment Interventions DME instruction;Gait training;Stair training;Functional mobility training;Therapeutic activities;Therapeutic exercise;Balance training;Patient/family education    PT Goals (Current goals can be found in the Care Plan section)  Acute Rehab PT Goals Patient Stated Goal: return to independent living PT Goal Formulation: With patient Time For Goal Achievement: 12/25/23 Potential to Achieve Goals: Good    Frequency Min 2X/week     Co-evaluation               AM-PAC PT "6 Clicks" Mobility  Outcome Measure Help needed turning from your back to your side while in a flat bed without using bedrails?: A Little Help needed moving from lying on your back to sitting on the side of a flat bed without using bedrails?: A Little Help needed moving to and from a bed to a chair (including a wheelchair)?: A Little Help needed standing up from a chair using your arms (e.g., wheelchair or bedside chair)?: A Little Help needed to walk in hospital room?: A Little Help needed climbing  3-5 steps with a railing? : A Little 6 Click Score: 18    End of Session Equipment Utilized During Treatment: Gait belt Activity Tolerance: Patient tolerated treatment well Patient left: in chair;with call bell/phone within reach;with chair alarm set;with nursing/sitter in room Nurse Communication: Mobility status PT Visit Diagnosis: Unsteadiness on feet (R26.81);Muscle weakness (generalized) (M62.81)    Time: 1141-1200 PT Time Calculation (min) (ACUTE ONLY): 19 min   Charges:   PT Evaluation $PT Eval Moderate Complexity: 1 Mod   PT General Charges $$ ACUTE PT VISIT: 1 Visit         Simone Dubois, PT, DPT Acute Rehabilitation Services Secure Chat Preferred Office: [phone]   Venus Ginsberg 12/11/2023, 1:04 PM

## 2023-12-11 NOTE — Progress Notes (Signed)
  Echocardiogram 2D Echocardiogram has been performed.  Farley Honer, RDCS 12/11/2023, 11:33 AM

## 2023-12-12 ENCOUNTER — Other Ambulatory Visit (HOSPITAL_COMMUNITY): Payer: Self-pay

## 2023-12-12 ENCOUNTER — Telehealth (HOSPITAL_COMMUNITY): Payer: Self-pay | Admitting: Pharmacy Technician

## 2023-12-12 DIAGNOSIS — I5189 Other ill-defined heart diseases: Secondary | ICD-10-CM

## 2023-12-12 DIAGNOSIS — R55 Syncope and collapse: Secondary | ICD-10-CM | POA: Diagnosis not present

## 2023-12-12 DIAGNOSIS — I429 Cardiomyopathy, unspecified: Secondary | ICD-10-CM | POA: Diagnosis not present

## 2023-12-12 DIAGNOSIS — I5020 Unspecified systolic (congestive) heart failure: Secondary | ICD-10-CM | POA: Diagnosis not present

## 2023-12-12 LAB — CBC
HCT: 39.1 % (ref 39.0–52.0)
Hemoglobin: 14.1 g/dL (ref 13.0–17.0)
MCH: 27.3 pg (ref 26.0–34.0)
MCHC: 36.1 g/dL — ABNORMAL HIGH (ref 30.0–36.0)
MCV: 75.6 fL — ABNORMAL LOW (ref 80.0–100.0)
Platelets: 221 10*3/uL (ref 150–400)
RBC: 5.17 MIL/uL (ref 4.22–5.81)
RDW: 15.6 % — ABNORMAL HIGH (ref 11.5–15.5)
WBC: 4.1 10*3/uL (ref 4.0–10.5)
nRBC: 0 % (ref 0.0–0.2)

## 2023-12-12 LAB — COMPREHENSIVE METABOLIC PANEL WITH GFR
ALT: 20 U/L (ref 0–44)
AST: 18 U/L (ref 15–41)
Albumin: 3.2 g/dL — ABNORMAL LOW (ref 3.5–5.0)
Alkaline Phosphatase: 73 U/L (ref 38–126)
Anion gap: 5 (ref 5–15)
BUN: 13 mg/dL (ref 8–23)
CO2: 26 mmol/L (ref 22–32)
Calcium: 9.3 mg/dL (ref 8.9–10.3)
Chloride: 107 mmol/L (ref 98–111)
Creatinine, Ser: 0.92 mg/dL (ref 0.61–1.24)
GFR, Estimated: >60 mL/min (ref >60)
Glucose, Bld: 179 mg/dL — ABNORMAL HIGH (ref 70–99)
Potassium: 3.8 mmol/L (ref 3.5–5.1)
Sodium: 138 mmol/L (ref 135–145)
Total Bilirubin: 0.9 mg/dL (ref 0.0–1.2)
Total Protein: 7.1 g/dL (ref 6.5–8.1)

## 2023-12-12 LAB — GLUCOSE, CAPILLARY
Glucose-Capillary: 145 mg/dL — ABNORMAL HIGH (ref 70–99)
Glucose-Capillary: 256 mg/dL — ABNORMAL HIGH (ref 70–99)
Glucose-Capillary: 110 mg/dL — ABNORMAL HIGH (ref 70–99)
Glucose-Capillary: 246 mg/dL — ABNORMAL HIGH (ref 70–99)

## 2023-12-12 MED ORDER — SACUBITRIL-VALSARTAN 49-51 MG PO TABS
1.0000 | ORAL_TABLET | Freq: Two times a day (BID) | ORAL | Status: DC
  Administered 2023-12-12 – 2023-12-13 (×3): 1 via ORAL
  Filled 2023-12-12 (×4): qty 1

## 2023-12-12 NOTE — Telephone Encounter (Signed)
 Patient Product/process development scientist completed.    The patient is insured through [employer]. Patient has Medicare and is not eligible for a copay card, but may be able to apply for patient assistance or Medicare RX Payment Plan (Patient Must reach out to their plan, if eligible for payment plan), if available.    Ran test claim for Entresto 24-26 mg and the current 30 day co-pay is $0.00.   This test claim was processed through [facility]- copay amounts may vary at other pharmacies due to pharmacy/plan contracts, or as the patient moves through the different stages of their insurance plan.     Morgan Arab, CPHT Pharmacy Technician III Certified Patient Advocate [facility] Patient Advocate Team Direct Number: [phone]  Fax: [phone]

## 2023-12-12 NOTE — Progress Notes (Signed)
 Mobility Specialist Progress Note:   12/12/23 0935  Mobility  Activity Transferred from bed to chair  Level of Assistance Minimal assist, patient does 75% or more  Assistive Device Front wheel walker  Distance Ambulated (ft) 3 ft  Activity Response Tolerated well  Mobility Referral Yes  Mobility visit 1 Mobility  Mobility Specialist Start Time (ACUTE ONLY) [id number]  Mobility Specialist Stop Time (ACUTE ONLY) 0854  Mobility Specialist Time Calculation (min) (ACUTE ONLY) 13 min   Pt received in bed and agreeable. Declined ambulation but agreeable to sit up in chair. Required minA for bed mobility and STS. Directional cues needed to direct pt to chair. No complaints throughout. Pt left in chair with call bell and all needs met. Chair alarm on.  D'Vante Nolon Baxter Mobility Specialist Please contact via Special educational needs teacher or Rehab office at [phone]

## 2023-12-12 NOTE — Progress Notes (Addendum)
 Patient Name: Christopher Rivas Date of Encounter: 12/12/2023 [facility] Cardiologist: None   Interval Summary  .    During interview patient stated that he is feeling well and that he wants to go home. Denies chest pain, shortness of breath, diaphoresis. Reported having abdominal discomfort prior to hospitalization. Reported having no history of heart failure and heart attack.   Vital Signs .    Vitals:   12/11/23 1548 12/11/23 2102 12/12/23 0455 12/12/23 0715  BP: 113/70 (!) 152/78 (!) 143/86 (!) 171/90  Pulse:  68 73 70  Resp:  18 18 18   Temp: 98 F (36.7 C) 97.8 F (36.6 C) 98.2 F (36.8 C) 98.4 F (36.9 C)  TempSrc: Oral Oral Oral   SpO2: 100% 100% 99% 99%  Weight:      Height:        Intake/Output Summary (Last 24 hours) at 12/12/2023 0941 Last data filed at 12/12/2023 0929 Gross per 24 hour  Intake 872.94 ml  Output 2250 ml  Net -1377.06 ml      12/10/2023   11:02 PM 12/10/2023    4:24 PM 07/23/2023    1:30 PM  Last 3 Weights  Weight (lbs) 177 lb 0.5 oz 167 lb 1.7 oz 167 lb  Weight (kg) 80.3 kg 75.8 kg 75.751 kg      Telemetry/ECG    Normal sinus rhythm with several episodes of ventricular bigeminy - Personally Reviewed  Physical Exam .   GEN: No acute distress.   Neck: No JVD Cardiac: RRR, no murmurs, rubs, or gallops.  Respiratory: Clear to auscultation bilaterally. GI: Soft, nontender, non-distended  MS: No edema  Assessment & Plan .     Reported syncope/unresponsiveness  There is not any obvious etiologies nor are there any indications that this was driven by a cardiac event.  No arrhythmias noted on telemetry, no pauses.  Troponins are negative.  Echocardiogram does show newly reduced EF and no significant valvular disease, do not think this is clinically significant in this context.  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  Not sure how much this  could be contributory, defer workup to primary team/neurology. -- Has no electrolyte derangements, glucose has been elevated so not likely hypoglycemic event. -- Polypharmacy also does not seem likely on current regiment. -- May consider heart monitor given lack of any other findings, though no indication of arrhythmias.   New onset Cardiomyopathy Appears euvolemic on physical exam. Had normal BNP and no findings of pulmonary congestion on imaging.  He did not note any chest pain or shortness of breath. Echocardiogram with EF 30 to 35% with global hypokinesis, normal RV function, compressible/normal IVC. -- Would reassess more closely tomorrow if he is amenable, however for the time being does not seem to be symptomatic and overall compensated.  EKG and wall motion do not indicate that he has had prior ischemic event. -- Would titrate GDMT if he and family are amenable and repeat echocardiogram in 2 to 3 months.  If EF is still down may consider ischemic evaluation at that point or if symptoms prompt sooner evaluation.   GDMT -- Also was hypotensive on arrival, given IV fluids with improvement in EF. Blood pressure has improved -- Was on lisinopril PTA, was admitted on 4/18 may be transitioned to ARB/Entresto if BP tolerates.  Stop amlodipine. -- Start Entresto 49/51 BID. -- Will avoid BB for now due to  history of recurrent syncope.   Cerebrovascular disease: based on imaging, prior lacunar infarcts Type II diabetes -continue aspirin , statin  For questions or updates, please contact [facility] Please consult [email or web address] for contact info under        Signed, Linh Johannes, PA-C

## 2023-12-12 NOTE — Evaluation (Signed)
 Occupational Therapy Evaluation Patient Details Name: Christopher Rivas MRN: [id number] DOB: [birth year] Today's Date: 12/12/2023   History of Present Illness   Pt is a 73 y/o male who presents 12/10/2023 after LOC while eating lunch with his son. Pt sitting down and did not fall. Upon EMS arrival, they initially could not find a radial pulse. BP in 90's systolically. PMH significant for DM, glaucoma, HTN, vision loss.     Clinical Impressions Pt presents with decline in function and safety with ADLs and ADL mobility with impaired balance and endurance. PTA pt lived at a SNF as a LTC resident and reports that he does not require assistance for bathing/dressing, toileting and uses SPC for mobility. Pt currenlty requires min A with LB ADLs, CGA with toileting and CGA with mobility using SPC. Pt requires verbal and directional cues throughout activity due to partial blindness at baseline. Pt would benefit from acute OT services to address impairments to maximize level of function and safety      If plan is discharge home, recommend the following:   A little help with bathing/dressing/bathroom;A little help with walking and/or transfers;Assist for transportation;Help with stairs or ramp for entrance;Direct supervision/assist for medications management     Functional Status Assessment   Patient has had a recent decline in their functional status and demonstrates the ability to make significant improvements in function in a reasonable and predictable amount of time.     Equipment Recommendations   None recommended by OT     Recommendations for Other Services         Precautions/Restrictions   Precautions Precautions: Fall;Other (comment) Recall of Precautions/Restrictions: Impaired Restrictions Weight Bearing Restrictions Per Provider Order: No     Mobility Bed Mobility               General bed mobility comments: pt in chair    Transfers Overall transfer level:  Needs assistance Equipment used: Straight cane Transfers: Sit to/from Stand Sit to Stand: Contact guard assist                  Balance Overall balance assessment: Needs assistance Sitting-balance support: Feet supported, No upper extremity supported Sitting balance-Leahy Scale: Fair     Standing balance support: Single extremity supported, During functional activity Standing balance-Leahy Scale: Fair                             ADL either performed or assessed with clinical judgement   ADL Overall ADL's : Needs assistance/impaired Eating/Feeding: Set up   Grooming: Wash/dry hands;Wash/dry face;Contact guard assist;Standing   Upper Body Bathing: Set up   Lower Body Bathing: Minimal assistance   Upper Body Dressing : Set up   Lower Body Dressing: Minimal assistance   Toilet Transfer: Contact guard assist;Rolling walker (2 wheels);Ambulation;Regular Toilet;BSC/3in1;Grab bars;Cueing for safety   Toileting- Clothing Manipulation and Hygiene: Contact guard assist;Sit to/from stand       Functional mobility during ADLs: Contact guard assist;Rolling walker (2 wheels);Cueing for safety General ADL Comments: Pt requires directional cues due to visual impairments     Vision Baseline Vision/History:  (partial blindness) Ability to See in Adequate Light: 3 Highly impaired Patient Visual Report: No change from baseline       Perception         Praxis         Pertinent Vitals/Pain Pain Assessment Pain Assessment: No/denies pain     Extremity/Trunk Assessment Upper Extremity  Assessment Upper Extremity Assessment: Generalized weakness;Right hand dominant   Lower Extremity Assessment Lower Extremity Assessment: Defer to PT evaluation   Cervical / Trunk Assessment Cervical / Trunk Assessment: Normal   Communication Communication Communication: No apparent difficulties   Cognition Arousal: Alert Behavior During Therapy: WFL for tasks  assessed/performed                                 Following commands: Intact       Cueing  General Comments   Cueing Techniques: Verbal cues;Gestural cues (directional cues)      Exercises     Shoulder Instructions      Home Living Family/patient expects to be discharged to:: Skilled nursing facility                                 Additional Comments: Pt currently residing in Independent Living.      Prior Functioning/Environment Prior Level of Function : Independent/Modified Independent             Mobility Comments: SPC all the time ADLs Comments: Pt reports he does not require assistance for bathing/dressing, toileting    OT Problem List: Decreased strength;Impaired balance (sitting and/or standing);Decreased activity tolerance;Impaired vision/perception   OT Treatment/Interventions: Self-care/ADL training;Patient/family education;Therapeutic activities;DME and/or AE instruction      OT Goals(Current goals can be found in the care plan section)   Acute Rehab OT Goals Patient Stated Goal: go home OT Goal Formulation: With patient Time For Goal Achievement: 12/26/23 Potential to Achieve Goals: Good ADL Goals Pt Will Perform Grooming: with supervision;with set-up;standing Pt Will Perform Lower Body Bathing: with contact guard assist;with supervision;sit to/from stand Pt Will Perform Lower Body Dressing: with contact guard assist;with supervision;sit to/from stand Pt Will Transfer to Toilet: with supervision;ambulating;regular height toilet;grab bars Pt Will Perform Toileting - Clothing Manipulation and hygiene: with supervision;with modified independence;sitting/lateral leans;sit to/from stand   OT Frequency:  Min 2X/week    Co-evaluation              AM-PAC OT "6 Clicks" Daily Activity     Outcome Measure Help from another person eating meals?: A Little Help from another person taking care of personal grooming?: A  Little Help from another person toileting, which includes using toliet, bedpan, or urinal?: A Little Help from another person bathing (including washing, rinsing, drying)?: A Little Help from another person to put on and taking off regular upper body clothing?: A Little Help from another person to put on and taking off regular lower body clothing?: A Little 6 Click Score: 18   End of Session Equipment Utilized During Treatment: Gait belt;Other (comment) (cane)  Activity Tolerance: Patient tolerated treatment well Patient left: in chair;with call bell/phone within reach;with chair alarm set  OT Visit Diagnosis: Unsteadiness on feet (R26.81);Other abnormalities of gait and mobility (R26.89)                Time: 1041-1105 OT Time Calculation (min): 24 min Charges:  OT General Charges $OT Visit: 1 Visit OT Evaluation $OT Eval Moderate Complexity: 1 Mod OT Treatments $Therapeutic Activity: 8-22 mins    Alfred Ann 12/12/2023, 1:09 PM

## 2023-12-12 NOTE — Care Management Obs Status (Signed)
 MEDICARE OBSERVATION STATUS NOTIFICATION   Patient Details  Name: Christopher Rivas MRN: [id number] Date of Birth: 10-07-1950   Medicare Observation Status Notification Given:    Moon/Obs telephonically given to Sameul Tagle  at [phone]   [facility] 12/12/2023, 10:14 AM

## 2023-12-12 NOTE — Plan of Care (Signed)

## 2023-12-12 NOTE — Progress Notes (Signed)
  Progress Note   Patient: Christopher Rivas GEX:[id number] DOB: 11-Sep-1950 DOA: 12/10/2023     0 DOS: the patient was seen and examined on 12/12/2023   Brief hospital course: 73 y.o. male with history of diabetes mellitus type 2, hypertension, hyperlipidemia, chronic anemia was brought to the ER after patient had a syncopal episode.  As per the patient's sister patient and the family were having a family get together having lunch when patient suddenly started becoming lethargic confused and lost consciousness.  He was not responding to the questions well but soon became awake appeared confused.  Did not have any incontinence of urine or bowel.  Patient was holding onto his stomach when this episode happened.  The whole episode lasted around twenty minutes.  EMS on arrival found patient's blood pressure was 90 systolic with CBG of 286.  Patient was given 500 cc fluid bolus and brought to the ER.     Per patient's family patient has had similar episode at least 3 times in the last few months.  Patient was recently started on insulin  for his diabetes melitis at his living facility last month.  Assessment and Plan:  Syncope -    Unclear etiology.  Pt did have abd discomfort and nausea prior to symptom onset. CT abd reviewed, notable for mod stool burden in a location that correlates to pt's abd discomfort. Possible vasovagal syncope Head imaging unremarkable.   Orthostatics neg Of note, 2d echo is notable for new EF of 30%. Appreciate input by Cardiology. Does not seem cardiogenic Hypertension Was hypotensive initially, thus amlodipine and lisinopril were held Now on entresto per Cardiology below GDMT per Cardiology below Diabetes mellitus type 2 with hyperglycemia and as per the family patient was recently started on insulin  last month.   On oral hypoglycemics PTA.   For now continued on insulin  glargine 12 units at bedtime along with sliding scale coverage. Hyperlipidemia on statins. Anemia appears to  be chronic check anemia panel. Prior history of prostate cancer. New systoilc heart failure, new diagnosis New EF of 30% Appreciate Cardiology input.  Entresto started today. Could consider SGLT2i  Currently appears euvolemic     Subjective: Without complaints today  Physical Exam: Vitals:   12/11/23 2102 12/12/23 0455 12/12/23 0715 12/12/23 1626  BP: (!) 152/78 (!) 143/86 (!) 171/90 108/60  Pulse: 68 73 70 66  Resp: 18 18 18 18   Temp: 97.8 F (36.6 C) 98.2 F (36.8 C) 98.4 F (36.9 C) 98.3 F (36.8 C)  TempSrc: Oral Oral    SpO2: 100% 99% 99% 97%  Weight:      Height:       General exam: Conversant, in no acute distress Respiratory system: normal chest rise, clear, no audible wheezing Cardiovascular system: regular rhythm, s1-s2 Gastrointestinal system: Nondistended, nontender, pos BS Central nervous system: No seizures, no tremors, baseline blindness Extremities: No cyanosis, no joint deformities Skin: No rashes, no pallor Psychiatry: Affect normal // no auditory hallucinations   Data Reviewed:  Labs reviewed: Na 138, K 3.8, Cr 0.92, WBC 4.1, Hgb 14.1  Family Communication: Pt in room, family at bedside  Disposition: Status is: Observation The patient remains OBS appropriate and will d/c before 2 midnights.  Planned Discharge Destination: Skilled nursing facility    Author: Cherylle Corwin, MD 12/12/2023 7:41 PM  For on call review [email or web address].

## 2023-12-13 ENCOUNTER — Inpatient Hospital Stay (HOSPITAL_COMMUNITY)
Admit: 2023-12-13 | Discharge: 2023-12-13 | Disposition: A | Discharge status: Home / Self Care | Attending: Student | Admitting: Student

## 2023-12-13 ENCOUNTER — Other Ambulatory Visit: Payer: Self-pay | Admitting: Student

## 2023-12-13 DIAGNOSIS — I5020 Unspecified systolic (congestive) heart failure: Secondary | ICD-10-CM | POA: Diagnosis not present

## 2023-12-13 DIAGNOSIS — R55 Syncope and collapse: Secondary | ICD-10-CM | POA: Diagnosis not present

## 2023-12-13 DIAGNOSIS — I429 Cardiomyopathy, unspecified: Secondary | ICD-10-CM | POA: Diagnosis not present

## 2023-12-13 LAB — COMPREHENSIVE METABOLIC PANEL WITH GFR
ALT: 20 U/L (ref 0–44)
AST: 17 U/L (ref 15–41)
Albumin: 3.4 g/dL — ABNORMAL LOW (ref 3.5–5.0)
Alkaline Phosphatase: 73 U/L (ref 38–126)
Anion gap: 8 (ref 5–15)
BUN: 16 mg/dL (ref 8–23)
CO2: 25 mmol/L (ref 22–32)
Calcium: 9.2 mg/dL (ref 8.9–10.3)
Chloride: 105 mmol/L (ref 98–111)
Creatinine, Ser: 0.93 mg/dL (ref 0.61–1.24)
GFR, Estimated: >60 mL/min (ref >60)
Glucose, Bld: 213 mg/dL — ABNORMAL HIGH (ref 70–99)
Potassium: 4.2 mmol/L (ref 3.5–5.1)
Sodium: 138 mmol/L (ref 135–145)
Total Bilirubin: 0.9 mg/dL (ref 0.0–1.2)
Total Protein: 7.3 g/dL (ref 6.5–8.1)

## 2023-12-13 LAB — CBC
HCT: 43.2 % (ref 39.0–52.0)
Hemoglobin: 15.6 g/dL (ref 13.0–17.0)
MCH: 27.4 pg (ref 26.0–34.0)
MCHC: 36.1 g/dL — ABNORMAL HIGH (ref 30.0–36.0)
MCV: 75.8 fL — ABNORMAL LOW (ref 80.0–100.0)
Platelets: 232 10*3/uL (ref 150–400)
RBC: 5.7 MIL/uL (ref 4.22–5.81)
RDW: 15.8 % — ABNORMAL HIGH (ref 11.5–15.5)
WBC: 6.3 10*3/uL (ref 4.0–10.5)
nRBC: 0 % (ref 0.0–0.2)

## 2023-12-13 LAB — GLUCOSE, CAPILLARY
Glucose-Capillary: 185 mg/dL — ABNORMAL HIGH (ref 70–99)
Glucose-Capillary: 273 mg/dL — ABNORMAL HIGH (ref 70–99)
Glucose-Capillary: 94 mg/dL (ref 70–99)

## 2023-12-13 MED ORDER — SACUBITRIL-VALSARTAN 49-51 MG PO TABS: 1.0000 | ORAL_TABLET | Freq: Two times a day (BID) | ORAL | 2 refills | Status: AC

## 2023-12-13 MED ORDER — INSULIN GLARGINE-YFGN 100 UNIT/ML ~~LOC~~ SOLN: 10.0000 [IU] | Freq: Every day | SUBCUTANEOUS | 11 refills | Status: AC

## 2023-12-13 NOTE — Progress Notes (Signed)
 Attempted 5 times to give report to [facility] but couldn't talk to anyone.  Phone kept on ringing.  Will try again.

## 2023-12-13 NOTE — Progress Notes (Signed)
 Consepcion Dell to be discharged Skilled nursing facility [facility]  per MD order. Patient verbalized understanding.  Skin clean, dry and intact without evidence of skin break down, no evidence of skin tears noted. IV catheter discontinued intact. Site without signs and symptoms of complications. Dressing and pressure applied. Pt denies pain at the site currently. No complaints noted.  Patient free of lines, drains, and wounds.   Discharge packet assembled. An After Visit Summary (AVS) was printed and given to the EMS personnel. Patient escorted via stretcher and discharged to [facility] via ambulance. Report called more than 5 times but nobody picked the call.  Report about Zio monitor was given to EMS personnel so that they can let them know at the facility.  Patient is also aware about the monitor.  Deberah Falconer, RN

## 2023-12-13 NOTE — Inpatient Diabetes Management (Addendum)
 Inpatient Diabetes Program Recommendations  AACE/ADA: New Consensus Statement on Inpatient Glycemic Control (2015)  Target Ranges:  Prepandial:   less than 140 mg/dL      Peak postprandial:   less than 180 mg/dL (1-2 hours)      Critically ill patients:  140 - 180 mg/dL   Lab Results  Component Value Date   GLUCAP 185 (H) 12/13/2023   HGBA1C 8.9 (H) 12/11/2023    Review of Glycemic Control  Latest Reference Range & Units 12/12/23 07:21 12/12/23 11:23 12/12/23 16:16 12/12/23 21:07 12/13/23 07:24  Glucose-Capillary 70 - 99 mg/dL 914 (H) 782 (H) 956 (H) 246 (H) 185 (H)   Diabetes history: DM 2 Outpatient Diabetes medications: Amaryl 2 mg Daily, Janumet 50-1000 mg Daily Current orders for Inpatient glycemic control:  Semglee  12 units qhs Novolog  0-9 units tid  A1c 8.9% ob 5/19  Inpatient Diabetes Program Recommendations:    -   Consider Novolog  2 units tid meal coverage to prevent postprandial spikes  Thanks,  Eloise Hake RN, MSN, BC-ADM Inpatient Diabetes Coordinator Team Pager [phone] (8a-5p)

## 2023-12-13 NOTE — TOC Transition Note (Signed)
 Transition of Care [facility]) - Discharge Note   Patient Details  Name: Christopher Rivas MRN: [id number] Date of Birth: [birth year]  Transition of Care [facility]) CM/SW Contact:  Juliane Och, LCSW Phone Number: 12/13/2023, 2:31 PM   Clinical Narrative:     Patient will DC to: [facility] LTC Anticipated DC date: 12/13/2023 Family notified: Jamorris Ndiaye; Brother; [phone] Transport by: Lyna Sandhoff   Per MD patient ready for DC to [facility] LTC. RN to call report prior to discharge [phone]). RN, patient's family, and facility notified of DC (patient is not currently fully oriented). Discharge Summary and FL2 sent to facility. DC packet on chart. Ambulance transport requested for patient at 14:31.   CSW will sign off for now as social work intervention is no longer needed. Please consult us  again if new needs arise.    Final next level of care: Skilled Nursing Facility Barriers to Discharge: Barriers Resolved   Patient Goals and CMS Choice Patient states their goals for this hospitalization and ongoing recovery are:: Return to snf CMS Medicare.gov Compare Post Acute Care list provided to:: Patient Represenative (must comment) Choice offered to / list presented to : Sibling      Discharge Placement              Patient chooses bed at: [facility] Patient to be transferred to facility by: [facility] Name of family member notified: Kaysan Peixoto; Brother; [phone] Patient and family notified of of transfer: 12/13/23  Discharge Plan and Services Additional resources added to the After Visit Summary for   In-house Referral: Clinical Social Work   Post Acute Care Choice: Skilled Nursing Facility                               Social Drivers of Health (SDOH) Interventions SDOH Screenings   Food Insecurity: Patient Declined (12/11/2023)  Housing: Patient Declined (12/11/2023)  Transportation Needs: No Transportation Needs (12/11/2023)  Utilities: Not At Risk  (12/11/2023)  Social Connections: Patient Declined (12/11/2023)  Tobacco Use: Low Risk  (12/10/2023)     Readmission Risk Interventions     No data to display

## 2023-12-13 NOTE — Discharge Summary (Addendum)
 Physician Discharge Summary  Christopher Rivas QIH:[id number] DOB: 01-03-1951 DOA: 12/10/2023  PCP: [facility]  Admit date: 12/10/2023 Discharge date: 12/13/2023  Time spent: 40 minutes  Recommendations for Outpatient Follow-up:  Requires Chem-7 CBC in about 1 week Monitor to be placed prior to discharge from hospital and will be followed by Dr. Janeece Mechanic in the outpatient setting Note medication changes Entresto stopping various other meds needs outpatient close follow-up New initiation of long-acting insulin  12 units  -check for hypoglycemia as outpatient  Discharge Diagnoses:  MAIN problem for hospitalization   Syncope  Please see below for itemized issues addressed in HOpsital- refer to other progress notes for clarity if needed  Discharge Condition: Improved  Diet recommendation: Diabetic heart healthy  Filed Weights   12/10/23 1624 12/10/23 2302  Weight: 75.8 kg 80.3 kg    History of present illness:  73 year old male with 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 DM ty2 recent placed on insulin  with glaucoma Proliferative diabetic retinopathy HTN HLD chronic anemia In 2022 he has had a previous episode of lightheadedness and syncope and came to the ED for this They were at a get together and patient suddenly became lethargic confused lost consciousness did not have any incontinence or bowel incontinence systolics were 90 systolic EKG showed normal sinus rhythm troponins were normal creatinine was normal blood sugar 260 CT head nonacute CT abdomen/pelvis unremarkable  He was admitted and echocardiogram showed an EF of 30% and cardiology consulted-he was in sinus rhythm predominantly during hospital stay He had no recurrence of symptoms, he seems to be perfusing well and it was felt that he had a vasovagal event--it is unclear the etiology of hisd In case this is an arrhythmia that we did not pick up on monitors patient was placed on long-term  monitor His medications were adjusted and he was taken off his amlodipine and lisinopril His blood sugar was moderately controlled but he had recently started on long-acting insulin  so we have resumed that in addition to Amaryl twice daily dosing and metaglip He was stabilized for discharge and will go d/c on an event monitor   Discharge Exam: Vitals:   12/13/23 0554 12/13/23 0723  BP: 133/88 (!) 139/90  Pulse: 87 80  Resp: 18 19  Temp: (!) 97.3 F (36.3 C) 98.2 F (36.8 C)  SpO2: 98% 100%    Subj on day of d/c   Awake engaged no distress Looks comfortable feels fair  General Exam on discharge  EOMI NCAT poor visual acuity S1-S2 no murmur Chest is clear ROM is intact Power is 5/5  Discharge Instructions   Discharge Instructions     Diet - low sodium heart healthy   Complete by: As directed    Increase activity slowly   Complete by: As directed       Allergies as of 12/13/2023   No Known Allergies      Medication List     STOP taking these medications    amLODipine 10 MG tablet Commonly known as: NORVASC   gabapentin 100 MG capsule Commonly known as: NEURONTIN   lisinopril 10 MG tablet Commonly known as: ZESTRIL       TAKE these medications    aspirin  EC 81 MG tablet Take 81 mg by mouth in the morning. Swallow whole.   Combigan  0.2-0.5 % ophthalmic solution Generic drug: brimonidine -timolol  Place 1 drop into both eyes every morning.   dorzolamide  2 % ophthalmic solution Commonly known as: TRUSOPT  Place 1 drop  into both eyes 2 (two) times daily.   glimepiride 2 MG tablet Commonly known as: AMARYL Take 2 mg by mouth daily with breakfast.   glimepiride 2 MG tablet Commonly known as: AMARYL Take 1 mg by mouth at bedtime.   insulin  glargine-yfgn 100 UNIT/ML injection Commonly known as: SEMGLEE  Inject 0.1 mLs (10 Units total) into the skin at bedtime.   Janumet 50-1000 MG tablet Generic drug: sitaGLIPtin-metformin Take 1 tablet by  mouth in the morning and at bedtime.   lovastatin 20 MG tablet Commonly known as: MEVACOR Take 20 mg by mouth every evening.   ondansetron  4 MG tablet Commonly known as: ZOFRAN  Take 4 mg by mouth as needed for vomiting or nausea.   sacubitril-valsartan 49-51 MG Commonly known as: ENTRESTO Take 1 tablet by mouth 2 (two) times daily.   vitamin B-12 500 MCG tablet Commonly known as: CYANOCOBALAMIN Take 500 mcg by mouth in the morning.       No Known Allergies    The results of significant diagnostics from this hospitalization (including imaging, microbiology, ancillary and laboratory) are listed below for reference.    Significant Diagnostic Studies: ECHOCARDIOGRAM COMPLETE Result Date: 12/11/2023    ECHOCARDIOGRAM REPORT   Patient Name:   Christopher Rivas Date of Exam: 12/11/2023 Medical Rec #:  [id number]     Height:       68.0 in Accession #:    [id number]    Weight:       177.0 lb Date of Birth:  1951-01-06     BSA:          1.940 m Patient Age:    73 years      BP:           131/70 mmHg Patient Gender: M             HR:           72 bpm. Exam Location:  Inpatient Procedure: 2D Echo, Cardiac Doppler and Color Doppler (Both Spectral and Color            Flow Doppler were utilized during procedure). Indications:    Syncope R55  History:        Patient has no prior history of Echocardiogram examinations.                 Signs/Symptoms:Syncope; Risk Factors:Diabetes, Hypertension and                 Dyslipidemia.  Sonographer:    Kip Peon RDCS Referring Phys: [id number] ARSHAD N KAKRAKANDY IMPRESSIONS  1. Left ventricular ejection fraction, by estimation, is 30 to 35%. The left ventricle has moderately decreased function. The left ventricle demonstrates global hypokinesis. Left ventricular diastolic parameters are consistent with Grade I diastolic dysfunction (impaired relaxation).  2. Right ventricular systolic function is normal. The right ventricular size is normal.  3. The mitral valve is  normal in structure. Trivial mitral valve regurgitation. No evidence of mitral stenosis.  4. The aortic valve is tricuspid. Aortic valve regurgitation is not visualized. No aortic stenosis is present.  5. The inferior vena cava is normal in size with greater than 50% respiratory variability, suggesting right atrial pressure of 3 mmHg. Comparison(s): No prior Echocardiogram. FINDINGS  Left Ventricle: Left ventricular ejection fraction, by estimation, is 30 to 35%. The left ventricle has moderately decreased function. The left ventricle demonstrates global hypokinesis. The left ventricular internal cavity size was normal in size. There is no left ventricular hypertrophy. Left ventricular  diastolic parameters are consistent with Grade I diastolic dysfunction (impaired relaxation). Right Ventricle: The right ventricular size is normal. Right ventricular systolic function is normal. Left Atrium: Left atrial size was normal in size. Right Atrium: Right atrial size was normal in size. Pericardium: There is no evidence of pericardial effusion. Mitral Valve: The mitral valve is normal in structure. Trivial mitral valve regurgitation. No evidence of mitral valve stenosis. Tricuspid Valve: The tricuspid valve is normal in structure. Tricuspid valve regurgitation is trivial. No evidence of tricuspid stenosis. Aortic Valve: The aortic valve is tricuspid. Aortic valve regurgitation is not visualized. No aortic stenosis is present. Pulmonic Valve: The pulmonic valve was normal in structure. Pulmonic valve regurgitation is not visualized. No evidence of pulmonic stenosis. Aorta: The aortic root is normal in size and structure. Venous: The inferior vena cava is normal in size with greater than 50% respiratory variability, suggesting right atrial pressure of 3 mmHg. IAS/Shunts: No atrial level shunt detected by color flow Doppler.  LEFT VENTRICLE PLAX 2D LVIDd:         3.40 cm   Diastology LVIDs:         2.70 cm   LV e' medial:     2.94 cm/s LV PW:         1.10 cm   LV E/e' medial:  13.3 LV IVS:        1.10 cm   LV e' lateral:   8.16 cm/s LVOT diam:     1.80 cm   LV E/e' lateral: 4.8 LV SV:         34 LV SV Index:   18 LVOT Area:     2.54 cm  RIGHT VENTRICLE RV Basal diam:  2.70 cm RV S prime:     6.53 cm/s TAPSE (M-mode): 1.0 cm LEFT ATRIUM             Index        RIGHT ATRIUM          Index LA diam:        3.10 cm 1.60 cm/m   RA Area:     9.90 cm LA Vol (A2C):   28.3 ml 14.58 ml/m  RA Volume:   19.20 ml 9.89 ml/m LA Vol (A4C):   24.3 ml 12.52 ml/m LA Biplane Vol: 27.2 ml 14.02 ml/m  AORTIC VALVE LVOT Vmax:   56.20 cm/s LVOT Vmean:  38.200 cm/s LVOT VTI:    0.135 m  AORTA Ao Root diam: 3.20 cm Ao Asc diam:  3.20 cm MITRAL VALVE MV Area (PHT): 1.74 cm    SHUNTS MV Decel Time: 437 msec    Systemic VTI:  0.14 m MR Peak grad: 35.5 mmHg    Systemic Diam: 1.80 cm MR Vmax:      298.00 cm/s MV E velocity: 39.00 cm/s MV A velocity: 85.80 cm/s MV E/A ratio:  0.45 Alexandria Angel MD Electronically signed by Alexandria Angel MD Signature Date/Time: 12/11/2023/1:42:08 PM    Final    MR BRAIN WO CONTRAST Result Date: 12/11/2023 CLINICAL DATA:  73 year old male with memory loss. Altered mental status. Reported history of diabetes and hypertension. EXAM: MRI HEAD WITHOUT CONTRAST TECHNIQUE: Multiplanar, multiecho pulse sequences of the brain and surrounding structures were obtained without intravenous contrast. COMPARISON:  Head CT yesterday. FINDINGS: Brain: Appearance of chronic severe mid brain, brainstem volume loss, deep gray nuclei volume loss. With superimposed extensive underlying chronic bilateral basal ganglia, bi thalamic, pontine lacunar infarcts. Severe corpus callosum volume loss also  with extensive bilateral cerebral white matter T2 and FLAIR hyperintensity superimposed on areas of CSF isointense white matter cystic encephalomalacia in both hemispheres. Patchy small chronic right cerebellum PICA territory infarct with encephalomalacia.  On SWI only a small number of punctate chronic microhemorrhages are identified in the cerebral hemispheres (series 7, image 75). And no definite supratentorial cortical encephalomalacia is identified. No restricted diffusion to suggest acute infarction. No midline shift, mass effect, evidence of mass lesion, ventriculomegaly, extra-axial collection or acute intracranial hemorrhage. Cervicomedullary junction and pituitary are within normal limits. Vascular: Major intracranial vascular flow voids are preserved. Skull and upper cervical spine: Normal for age visible cervical spine. Visualized bone marrow signal is within normal limits. Sinuses/Orbits: Postoperative changes to both globes. Questionable asymmetric optic nerve atrophy greater on the left (series 9, image 25). Minor paranasal sinus mucosal thickening. Other: Mastoids are clear. Grossly normal visible internal auditory structures. Negative visible scalp soft tissue; small left anterior frontal sebaceous cyst. IMPRESSION: 1. No acute intracranial abnormality, but chronic severe changes in the brain and brainstem. Main differential considerations are: - very advanced chronic small vessel disease throughout the cerebral white matter, bilateral deep gray nuclei, and pons. - advanced chronic demyelinating disease. - or a combination. 2. 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 Electronically Signed   By: Marlise Simpers M.D.   On: 12/11/2023 10:03   CT RENAL STONE STUDY Result Date: 12/10/2023 CLINICAL DATA:  Abdominal pain, flank pain EXAM: CT ABDOMEN AND PELVIS WITHOUT CONTRAST TECHNIQUE: Multidetector CT imaging of the abdomen and pelvis was performed following the standard protocol without IV contrast. RADIATION DOSE REDUCTION: This exam was performed according to the departmental dose-optimization program which includes automated exposure control, adjustment of the mA and/or kV according to patient size and/or use of iterative  reconstruction technique. COMPARISON:  10/22/2021 FINDINGS: Lower chest: Diffuse 3 vessel coronary artery disease, scattered aortic atherosclerosis. No acute findings. Hepatobiliary: Small layering gallstones within the gallbladder. No focal hepatic abnormality. Pancreas: No focal abnormality or ductal dilatation. Spleen: No focal abnormality.  Normal size. Adrenals/Urinary Tract: No adrenal abnormality. No focal renal abnormality. No stones or hydronephrosis. Urinary bladder is unremarkable. Stomach/Bowel: Moderate stool burden in the left colon. Stomach, large and small bowel grossly unremarkable. Normal appendix. Vascular/Lymphatic: Aortic atherosclerosis. No evidence of aneurysm or adenopathy. Reproductive: Prostate enlargement. Other: No free fluid or free air. Musculoskeletal: No acute bony abnormality. IMPRESSION: No renal or ureteral stones.  No hydronephrosis. Small layering gallstones.  No evidence of acute cholecystitis. Moderate stool burden in the left colon. Prostatomegaly. Aortic atherosclerosis. Electronically Signed   By: Janeece Mechanic M.D.   On: 12/10/2023 22:04   CT HEAD WO CONTRAST ( ) Result Date: 12/10/2023 CLINICAL DATA:  Memory loss EXAM: CT HEAD WITHOUT CONTRAST TECHNIQUE: Contiguous axial images were obtained from the base of the skull through the vertex without intravenous contrast. RADIATION DOSE REDUCTION: This exam was performed according to the departmental dose-optimization program which includes automated exposure control, adjustment of the mA and/or kV according to patient size and/or use of iterative reconstruction technique. COMPARISON:  CT head 07/23/2023. 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 Remote right PICA territory infarct. No evidence of new/interval acute large vascular territory infarct, acute hemorrhage, mass lesion or midline shift. No hydrocephalus. Vascular: No hyperdense vessel.  Calcific  atherosclerosis. Skull: No acute fracture. Sinuses/Orbits: Clear sinuses.  No acute orbital findings. Other: No mastoid effusions. IMPRESSION: 1. No evidence of acute  intracranial abnormality. 2. Multiple remote infarcts. Electronically Signed   By: Stevenson Elbe M.D.   On: 12/10/2023 21:27   DG Chest Port 1 View Result Date: 12/10/2023 CLINICAL DATA:  syncope EXAM: PORTABLE CHEST 1 VIEW COMPARISON:  July 23, 2023 FINDINGS: The cardiomediastinal silhouette is unchanged in contour. No pleural effusion. No pneumothorax. No acute pleuroparenchymal abnormality. IMPRESSION: No acute cardiopulmonary abnormality. Electronically Signed   By: Clancy Crimes M.D.   On: 12/10/2023 17:31    Microbiology: No results found for this or any previous visit (from the past 240 hours).   Labs: Basic Metabolic Panel: Recent Labs  Lab 12/10/23 1626 12/11/23 0140 12/11/23 0538 12/12/23 0520 12/13/23 0452  NA 137  --  138 138 138  K 4.3  --  3.8 3.8 4.2  CL 107  --  106 107 105  CO2 23  --  27 26 25   GLUCOSE 260*  --  225* 179* 213*  BUN 17  --  14 13 16   CREATININE 1.18 1.14 1.08 0.92 0.93  CALCIUM 8.8*  --  9.3 9.3 9.2   Liver Function Tests: Recent Labs  Lab 12/11/23 0140 12/11/23 0538 12/12/23 0520 12/13/23 0452  AST 19 18 18 17   ALT 21 20 20 20   ALKPHOS 71 75 73 73  BILITOT 0.8 0.9 0.9 0.9  PROT 7.2 7.3 7.1 7.3  ALBUMIN 3.3* 3.4* 3.2* 3.4*   No results for input(s): "LIPASE", "AMYLASE" in the last 168 hours. No results for input(s): "AMMONIA" in the last 168 hours. CBC: Recent Labs  Lab 12/10/23 1626 12/11/23 0140 12/11/23 0538 12/12/23 0520 12/13/23 0452  WBC 5.5 4.9 4.7 4.1 6.3  NEUTROABS 3.5  --   --   --   --   HGB 12.9* 13.9 13.7 14.1 15.6  HCT 37.1* 39.1 39.0 39.1 43.2  MCV 77.8* 77.1* 76.6* 75.6* 75.8*  PLT 202 203 240 221 232   Cardiac Enzymes: No results for input(s): "CKTOTAL", "CKMB", "CKMBINDEX", "TROPONINI" in the last 168 hours. BNP: BNP (last 3  results) Recent Labs    12/10/23 1626  BNP 17.0    ProBNP (last 3 results) No results for input(s): "PROBNP" in the last 8760 hours.  CBG: Recent Labs  Lab 12/12/23 0721 12/12/23 1123 12/12/23 1616 12/12/23 2107 12/13/23 0724  GLUCAP 145* 256* 110* 246* 185*    Signed:  Verlie Glisson MD   [facility] 12/13/2023, 11:02 AM

## 2023-12-13 NOTE — Progress Notes (Addendum)
 Patient Name: Christopher Rivas Date of Encounter: 12/13/2023 [facility] Cardiologist: None   Interval Summary  .    Continues to feel well. Denies chest pain, shortness of breath, palpitations. Reported having abdominal pain when coming in that has resolved.  Vital Signs .    Vitals:   12/12/23 1626 12/12/23 2104 12/13/23 0554 12/13/23 0723  BP: 108/60 (!) 147/83 133/88 (!) 139/90  Pulse: 66 70 87 80  Resp: 18 18 18 19   Temp: 98.3 F (36.8 C) 97.9 F (36.6 C) (!) 97.3 F (36.3 C) 98.2 F (36.8 C)  TempSrc:      SpO2: 97% 98% 98% 100%  Weight:      Height:        Intake/Output Summary (Last 24 hours) at 12/13/2023 0746 Last data filed at 12/13/2023 0600 Gross per 24 hour  Intake 1200 ml  Output 2845 ml  Net -1645 ml      12/10/2023   11:02 PM 12/10/2023    4:24 PM 07/23/2023    1:30 PM  Last 3 Weights  Weight (lbs) 177 lb 0.5 oz 167 lb 1.7 oz 167 lb  Weight (kg) 80.3 kg 75.8 kg 75.751 kg      Telemetry/ECG    Normal sinus rhythm few PAC's and PVC's - Personally Reviewed  Physical Exam .   GEN: No acute distress.  Sitting up on chair. On room air during visit. Neck: No JVD Cardiac: RRR, no murmurs, rubs, or gallops.  Respiratory: Clear to auscultation bilaterally. GI: Soft, nontender, non-distended  MS: No edema  Assessment & Plan .    Christopher Rivas is a 73 y.o. male with a hx of type 2 diabetes, hypertension, hyperlipidemia, chronic anemia, vision loss who is being seen 12/11/2023 for the evaluation of unresponsiveness/syncope/new heart failure at the request of Dr. Joan Mouton.    Reported syncope/unresponsiveness  There is not any obvious etiologies nor are there any indications that this was driven by a cardiac event.  No arrhythmias noted on telemetry, no pauses.  Troponins are negative.  Echocardiogram does show newly reduced EF and no significant valvular disease, do not think this is clinically significant in this context.  Brain MRI negative for  acute pathology but did show very advanced chronic small vessel disease and advanced demyelinating disease.  Not sure how much this could be contributory, defer workup to primary team/neurology. -- Has no electrolyte derangements, glucose has been elevated so not likely hypoglycemic event. -- Polypharmacy also does not seem likely on current regiment. -- Plan for 14 day live zio monitor. There have been no arhythmia's on telemetry but no other causes of syncope/unresponsiveness have been found.   New onset Cardiomyopathy Appears euvolemic on physical exam. Had normal BNP and no findings of pulmonary congestion on imaging.  He did not note any chest pain or shortness of breath. Echocardiogram with EF 30 to 35% with global hypokinesis, normal RV function, compressible/normal IVC. -- Would reassess more closely tomorrow if he is amenable, however for the time being does not seem to be symptomatic and overall compensated.  EKG and wall motion do not indicate that he has had prior ischemic event. -- Would titrate GDMT if he and family are amenable and repeat echocardiogram in 2 to 3 months.  If EF is still down may consider ischemic evaluation at that point or if symptoms prompt sooner evaluation.    GDMT -- Also was hypotensive on arrival, given IV fluids with improvement in EF. Blood  pressure has improved -- Was on lisinopril PTA, has been held during admission.  Stop amlodipine. -- continue Entresto 49/51 BID. -- concern with starting SGLT2 and MRA with possible UTI on 07/2023 and recent concerns for dehydration. -- Will avoid BB for now due to history of recurrent syncope.     Cerebrovascular disease -- based on imaging, prior lacunar infarcts.  -- management per primary  Type II diabetes -- continue aspirin , pravastatin  20 -- appears was not on statin PTA. -- Order lipid panel -- Goal LDL <70. -- management per primary  For questions or updates, please contact [facility] Please consult [email or web address] for contact info under        Signed, Darril Patriarca, PA-C

## 2023-12-13 NOTE — Progress Notes (Signed)
 Per discussion with Dr. Veryl Gottron, 14 day Zio ordered to be placed prior to discharge. F/u also arranged (we are aware Zio will not be resulted by time of follow-up, but needed given transition to Entresto and plan for progression of GDMT as tolerated). Nursing staff aware not to formally let pt go until Zio is placed.

## 2023-12-13 NOTE — Progress Notes (Signed)
 Heart Failure Navigator Progress Note  Assessed for Heart & Vascular TOC clinic readiness.  Patient has [facility] appt on 6/3.   Navigator available for reassessment of patient.   Jerilyn Monte, PharmD, BCPS Heart Failure Stewardship Pharmacist Phone [phone]

## 2023-12-13 NOTE — Progress Notes (Signed)
 Mobility Specialist Progress Note:    12/13/23 0900  Mobility  Activity Transferred from bed to chair  Level of Assistance Minimal assist, patient does 75% or more  Assistive Device Other (Comment) (HHA)  Distance Ambulated (ft) 3 ft  Activity Response Tolerated well  Mobility Referral Yes  Mobility visit 1 Mobility  Mobility Specialist Start Time (ACUTE ONLY) 0848  Mobility Specialist Stop Time (ACUTE ONLY) 0854  Mobility Specialist Time Calculation (min) (ACUTE ONLY) 6 min   Pt received in bed and agreeable w/ encouragement. Seemed to be more confused this date and thinking it is late at night and he is already up in chair. Required minA to come EOB and stand pivot to chair w/ HHA. Declined use of AD. Pt left in chair with call bell and all needs met. Chair alarm on.  Christopher Rivas Mobility Specialist Please contact via Special educational needs teacher or Rehab office at [phone]

## 2023-12-13 NOTE — Care Management Obs Status (Signed)
 MEDICARE OBSERVATION STATUS NOTIFICATION   Patient Details  Name: Christopher Rivas MRN: [id number] Date of Birth: 1951-07-16   Medicare Observation Status Notification Given:  Yes   Moon/Obs telephonically given to Demarea Lorey at [phone]  [facility] 12/13/2023, 8:35 AM

## 2023-12-13 NOTE — Progress Notes (Signed)
 Physical Therapy Treatment Patient Details Name: Christopher Rivas MRN: [id number] DOB: February 01, 1951 Today's Date: 12/13/2023   History of Present Illness Pt is a 73 y/o male who presents 12/10/2023 after LOC while eating lunch with his son. Pt sitting down and did not fall. Upon EMS arrival, they initially could not find a radial pulse. BP in 90's systolically. PMH significant for DM, glaucoma, HTN, vision loss.    PT Comments  Pt in recliner on arrival. CGA transfers, and min assist amb 200' with cane. Fair sitting and standing balance. Directional cues needed due to vision. Pt returned to recliner at end of session.     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    Transfers Overall transfer level: Needs assistance Equipment used: Straight cane Transfers: Sit to/from Stand Sit to Stand: Contact guard assist           General transfer comment: increased time    Ambulation/Gait Ambulation/Gait assistance: Min assist Gait Distance (Feet): 200 Feet Assistive device: Straight cane Gait Pattern/deviations: Step-through pattern, Decreased stride length, Trunk flexed, Drifts right/left Gait velocity: decreased Gait velocity interpretation: <1.31 ft/sec, indicative of household ambulator   General Gait Details: directional cues and cues for obstacles due to  vision   Stairs             Wheelchair Mobility     Tilt Bed    Modified Rankin (Stroke Patients Only)       Balance Overall balance assessment: Needs assistance Sitting-balance support: Feet supported, No upper extremity supported Sitting balance-Leahy Scale: Fair     Standing balance support: Single extremity supported, During functional activity Standing balance-Leahy Scale: Fair                              Hotel manager: No apparent difficulties  Cognition Arousal: Alert Behavior During Therapy: WFL for tasks assessed/performed   PT - Cognitive impairments: No family/caregiver present to determine baseline                       PT - Cognition Comments: likely at/near baseline Following commands: Intact      Cueing Cueing Techniques: Verbal cues, Gestural cues, Tactile cues  Exercises      General Comments        Pertinent Vitals/Pain Pain Assessment Pain Assessment: No/denies pain    Home Living                          Prior Function            PT Goals (current goals can now be found in the care plan section) Acute Rehab PT Goals Patient Stated Goal: to walk Progress towards PT goals: Progressing toward goals    Frequency    Min 2X/week  PT Plan      Co-evaluation              AM-PAC PT "6 Clicks" Mobility   Outcome Measure  Help needed turning from your back to your side while in a flat bed without using bedrails?: A Little Help needed moving from lying on your back to sitting on the side of a flat bed without using bedrails?: A Little Help needed moving to and from a bed to a chair (including a wheelchair)?: A Little Help needed standing up from a chair using your arms (e.g., wheelchair or bedside chair)?: A Little Help needed to walk in hospital room?: A Little Help needed climbing 3-5 steps with a railing? : A Lot 6 Click Score: 17    End of  Session Equipment Utilized During Treatment: Gait belt Activity Tolerance: Patient tolerated treatment well Patient left: in chair;with call bell/phone within reach;with chair alarm set Nurse Communication: Mobility status PT Visit Diagnosis: Unsteadiness on feet (R26.81);Muscle weakness (generalized) (M62.81)     Time: 1610-9604 PT Time Calculation (min) (ACUTE ONLY): 11 min  Charges:    $Gait Training: 8-22 mins PT General Charges $$ ACUTE PT VISIT: 1 Visit                     Dorothye Gathers., PT  Office # [phone]    Guadelupe Leech 12/13/2023, 10:46 AM

## 2023-12-14 ENCOUNTER — Telehealth (HOSPITAL_COMMUNITY): Payer: Self-pay

## 2023-12-14 NOTE — Telephone Encounter (Signed)
 Attempted to contact the patient to schedule VAS US .  No answer.  Left message.  Third Attempt. Provided  direct contact number for scheduling: [phone].   12/14/23 lvm 11/23/23 lvm 11/07/23 lvm   Forwarding information to referring office/provider. Order to be canceled in 1 week if no response from patient.

## 2023-12-26 ENCOUNTER — Ambulatory Visit: Attending: Emergency Medicine | Admitting: Emergency Medicine

## 2023-12-26 NOTE — Progress Notes (Deleted)
  Cardiology Office Note:    Date:  12/26/2023  ID:  Christopher Rivas, DOB April 02, 1951, MRN [id number] PCP: [facility] Providers Cardiologist:  Sheryle Donning, MD { Click to update primary MD,subspecialty MD or APP then REFRESH:1}    {Click to Open Review  :1}   Patient Profile:       Chief Complaint: *** History of Present Illness:  Christopher Rivas is a 72 y.o. male with visit-pertinent history of type 2 diabetes, hypertension, hyperlipidemia, chronic anemia, vision loss  Patient was seen in the hospital for evaluation of unresponsiveness/syncope/new heart failure on 12/11/2023.  Per chart review prior to admission patient did not have any cardiac history.  He has been evaluated for sudden episode of lethargy, confusion, and loss of consciousness upon a family lunch.  Reportedly was not responding to questions appropriately.  Episode lasted approximately 20 minutes with no evidence of seizure activity.  He was hypertensive on arrival to the ED with BP of 90 systolic.  Brain MRI indicated no acute pathology however did have very advanced chronic small vessel disease, advanced chronic demyelinating disease or combination of both.  His echocardiogram did show newly reduced LVEF of 30 to 35% and cardiology was consulted.  There was no obvious etiologies nor there are any indications this was driven by cardiac event.  No arrhythmias noted on telemetry and no pauses.  Troponins were negative.  Patient was started on Entresto  49/51 twice daily.  There was concern for starting SGLT2 and MRA with possible UTI 07/2023 and recent concerns for dehydration.  Beta-blocker was avoided due to history of recurrent syncope.  14 days live ZIO monitor was ordered to be started at discharge and cardiology signed off.  Discussed the use of AI scribe software for clinical note transcription with the patient, who gave verbal consent to proceed.  History of Present Illness     Review of  systems:  Please see the history of present illness. All other systems are reviewed and otherwise negative. ***      Studies Reviewed:        ***  Risk Assessment/Calculations:   {Does this patient have ATRIAL FIBRILLATION?:[phone]} No BP recorded.  {Refresh Note OR Click here to enter BP  :1}***        Physical Exam:   VS:  There were no vitals taken for this visit.   Wt Readings from Last 3 Encounters:  12/10/23 177 lb 0.5 oz (80.3 kg)  07/23/23 167 lb (75.8 kg)  06/02/23 184 lb (83.5 kg)    GEN: Well nourished, well developed in no acute distress NECK: No JVD; No carotid bruits CARDIAC: ***RRR, no murmurs, rubs, gallops RESPIRATORY:  Clear to auscultation without rales, wheezing or rhonchi  ABDOMEN: Soft, non-tender, non-distended EXTREMITIES:  No edema; No acute deformity ***      Assessment and Plan:  Assessment and Plan Assessment & Plan      {Are you ordering a CV Procedure (e.g. stress test, cath, DCCV, TEE, etc)?   Press F2        :[id number]}  Dispo:  No follow-ups on file.  Signed, Ava Boatman, NP

## 2023-12-27 ENCOUNTER — Telehealth: Payer: Self-pay | Admitting: Cardiology

## 2023-12-27 NOTE — Telephone Encounter (Signed)
 Pts home care nurse calling stating that on the discharge note from the hospital pt was to be sent home with a Zio monitor and f/u with Dr. Veryl Gottron but the nurse was unsure if the pt has on the correct device. Please advise

## 2023-12-27 NOTE — Telephone Encounter (Signed)
 Called and spoke to pt's home health nurse, Burdette Carolin. Explained that Zio Live monitor will be mailed back in small mailing pouch located within [facility] monitor gray case. Kim to f/u and call back if she has any other further questions.

## 2024-01-05 NOTE — Addendum Note (Signed)
 Encounter addended by: Lovell Roe N on: 01/05/2024 3:15 PM  Actions taken: Imaging Exam ended

## 2024-01-07 DIAGNOSIS — R55 Syncope and collapse: Secondary | ICD-10-CM | POA: Diagnosis not present

## 2024-01-09 ENCOUNTER — Ambulatory Visit: Payer: Self-pay | Admitting: Student

## 2024-01-24 ENCOUNTER — Ambulatory Visit: Admitting: Podiatry

## 2024-01-29 NOTE — Telephone Encounter (Signed)
 lvmtcb to sch hospital f/u

## 2024-02-09 ENCOUNTER — Encounter (HOSPITAL_COMMUNITY): Payer: Self-pay

## 2024-08-22 ENCOUNTER — Encounter: Payer: Self-pay | Admitting: Podiatry

## 2024-08-22 ENCOUNTER — Ambulatory Visit: Admitting: Podiatry

## 2024-08-22 DIAGNOSIS — E1149 Type 2 diabetes mellitus with other diabetic neurological complication: Secondary | ICD-10-CM | POA: Diagnosis not present

## 2024-08-22 DIAGNOSIS — B351 Tinea unguium: Secondary | ICD-10-CM | POA: Diagnosis not present

## 2024-08-22 DIAGNOSIS — M79675 Pain in left toe(s): Secondary | ICD-10-CM

## 2024-08-22 DIAGNOSIS — M79674 Pain in right toe(s): Secondary | ICD-10-CM | POA: Diagnosis not present

## 2024-08-22 NOTE — Progress Notes (Signed)
 This patient returns to my office for at risk foot care.  This patient requires this care by a professional since this patient will be at risk due to having diabetes.    This patient is unable to cut nails himself since the patient cannot reach his nails.These nails are painful walking and wearing shoes. This patient presents to the office with his aide. This patient presents for at risk foot care today.  General Appearance  Alert, conversant and in no acute stress.  Vascular  Dorsalis pedis and posterior tibial  pulses are not  palpable  bilaterally.  Capillary return is within normal limits  bilaterally. Temperature is within normal limits  bilaterally.  Neurologic  Senn-Weinstein monofilament wire test diminished   bilaterally. Muscle power within normal limits bilaterally.  Nails Thick disfigured discolored nails with subungual debris  from hallux to fifth toes bilaterally. No evidence of bacterial infection or drainage bilaterally.  Orthopedic  No limitations of motion  feet .  No crepitus or effusions noted.  No bony pathology or digital deformities noted.  Skin  normotropic skin with no porokeratosis noted bilaterally.  No signs of infections or ulcers noted.     Onychomycosis  Pain in right toes  Pain in left toes  Consent was obtained for treatment procedures.   Mechanical debridement of nails 1-5  bilaterally performed with a nail nipper.  Filed with dremel without incident.    Return office visit   16   weeks                 Told patient to return for periodic foot care and evaluation due to potential at risk complications.   Cordella Bold DPM

## 2024-12-20 ENCOUNTER — Ambulatory Visit: Admitting: Podiatry
